# Patient Record
Sex: Female | Born: 1965 | Race: White | Hispanic: No | Marital: Single | Smoking: Former smoker
Health system: Southern US, Community
[De-identification: ages and names within clinical notes are randomized; demographics above are authoritative.]

## PROBLEM LIST (undated history)

## (undated) DIAGNOSIS — I10 Essential (primary) hypertension: Secondary | ICD-10-CM

## (undated) DIAGNOSIS — K5792 Diverticulitis of intestine, part unspecified, without perforation or abscess without bleeding: Secondary | ICD-10-CM

## (undated) DIAGNOSIS — F32A Depression, unspecified: Secondary | ICD-10-CM

## (undated) DIAGNOSIS — F329 Major depressive disorder, single episode, unspecified: Secondary | ICD-10-CM

## (undated) DIAGNOSIS — F419 Anxiety disorder, unspecified: Secondary | ICD-10-CM

## (undated) DIAGNOSIS — R079 Chest pain, unspecified: Secondary | ICD-10-CM

## (undated) HISTORY — DX: Major depressive disorder, single episode, unspecified: F32.9

## (undated) HISTORY — DX: Essential (primary) hypertension: I10

## (undated) HISTORY — DX: Depression, unspecified: F32.A

## (undated) HISTORY — DX: Chest pain, unspecified: R07.9

## (undated) HISTORY — PX: COLON SURGERY: SHX602

## (undated) HISTORY — DX: Anxiety disorder, unspecified: F41.9

---

## 1991-02-08 HISTORY — PX: CERVICAL CONIZATION W/BX: SHX1330

## 2014-02-20 ENCOUNTER — Ambulatory Visit: Payer: Self-pay | Admitting: Gastroenterology

## 2014-05-29 ENCOUNTER — Encounter: Payer: Self-pay | Admitting: Family Medicine

## 2014-05-29 DIAGNOSIS — I10 Essential (primary) hypertension: Secondary | ICD-10-CM | POA: Insufficient documentation

## 2014-05-29 DIAGNOSIS — Z8541 Personal history of malignant neoplasm of cervix uteri: Secondary | ICD-10-CM | POA: Insufficient documentation

## 2014-05-29 DIAGNOSIS — K219 Gastro-esophageal reflux disease without esophagitis: Secondary | ICD-10-CM | POA: Insufficient documentation

## 2014-05-29 DIAGNOSIS — F419 Anxiety disorder, unspecified: Secondary | ICD-10-CM | POA: Insufficient documentation

## 2014-05-29 DIAGNOSIS — F324 Major depressive disorder, single episode, in partial remission: Secondary | ICD-10-CM | POA: Insufficient documentation

## 2014-08-13 ENCOUNTER — Other Ambulatory Visit: Payer: Self-pay

## 2014-08-13 DIAGNOSIS — F411 Generalized anxiety disorder: Secondary | ICD-10-CM

## 2014-08-13 MED ORDER — LORAZEPAM 0.5 MG PO TABS
0.5000 mg | ORAL_TABLET | Freq: Three times a day (TID) | ORAL | Status: DC | PRN
Start: 2014-08-13 — End: 2015-06-08

## 2014-08-13 NOTE — Telephone Encounter (Signed)
Pharmacy faxed over Rx request. Thanks.

## 2014-09-01 ENCOUNTER — Other Ambulatory Visit: Payer: Self-pay

## 2014-09-03 ENCOUNTER — Other Ambulatory Visit: Payer: Self-pay

## 2014-09-03 DIAGNOSIS — K219 Gastro-esophageal reflux disease without esophagitis: Secondary | ICD-10-CM

## 2014-09-03 MED ORDER — PANTOPRAZOLE SODIUM 40 MG PO TBEC: 40.0000 mg | DELAYED_RELEASE_TABLET | Freq: Every day | ORAL | Status: DC

## 2014-09-03 NOTE — Telephone Encounter (Signed)
error 

## 2014-09-08 ENCOUNTER — Telehealth: Payer: Self-pay

## 2014-09-09 NOTE — Telephone Encounter (Signed)
I show pt taking lisinopril/HCTZ not quinapril.

## 2014-09-10 ENCOUNTER — Other Ambulatory Visit: Payer: Self-pay

## 2014-09-11 ENCOUNTER — Other Ambulatory Visit: Payer: Self-pay

## 2014-09-22 ENCOUNTER — Other Ambulatory Visit: Payer: Self-pay | Admitting: Family Medicine

## 2015-05-03 ENCOUNTER — Other Ambulatory Visit: Payer: Self-pay | Admitting: Gastroenterology

## 2015-05-06 ENCOUNTER — Other Ambulatory Visit: Payer: Self-pay

## 2015-05-06 DIAGNOSIS — K2100 Gastro-esophageal reflux disease with esophagitis, without bleeding: Secondary | ICD-10-CM

## 2015-05-06 MED ORDER — PANTOPRAZOLE SODIUM 40 MG PO TBEC: 40.0000 mg | DELAYED_RELEASE_TABLET | Freq: Every day | ORAL | Status: DC

## 2015-06-08 ENCOUNTER — Encounter: Payer: Self-pay | Admitting: Family Medicine

## 2015-06-08 ENCOUNTER — Ambulatory Visit (INDEPENDENT_AMBULATORY_CARE_PROVIDER_SITE_OTHER): Payer: BLUE CROSS/BLUE SHIELD | Admitting: Family Medicine

## 2015-06-08 VITALS — BP 122/86 | HR 72 | Temp 99.8°F | Resp 16 | Ht 64.5 in | Wt 156.0 lb

## 2015-06-08 DIAGNOSIS — Z23 Encounter for immunization: Secondary | ICD-10-CM | POA: Diagnosis not present

## 2015-06-08 DIAGNOSIS — H73012 Bullous myringitis, left ear: Secondary | ICD-10-CM

## 2015-06-08 DIAGNOSIS — J0100 Acute maxillary sinusitis, unspecified: Secondary | ICD-10-CM | POA: Diagnosis not present

## 2015-06-08 DIAGNOSIS — Z720 Tobacco use: Secondary | ICD-10-CM | POA: Diagnosis not present

## 2015-06-08 DIAGNOSIS — E663 Overweight: Secondary | ICD-10-CM

## 2015-06-08 DIAGNOSIS — E785 Hyperlipidemia, unspecified: Secondary | ICD-10-CM | POA: Diagnosis not present

## 2015-06-08 DIAGNOSIS — E559 Vitamin D deficiency, unspecified: Secondary | ICD-10-CM

## 2015-06-08 DIAGNOSIS — K222 Esophageal obstruction: Secondary | ICD-10-CM

## 2015-06-08 DIAGNOSIS — I10 Essential (primary) hypertension: Secondary | ICD-10-CM

## 2015-06-08 DIAGNOSIS — F32A Depression, unspecified: Secondary | ICD-10-CM

## 2015-06-08 DIAGNOSIS — F329 Major depressive disorder, single episode, unspecified: Secondary | ICD-10-CM

## 2015-06-08 DIAGNOSIS — F17200 Nicotine dependence, unspecified, uncomplicated: Secondary | ICD-10-CM

## 2015-06-08 MED ORDER — AZITHROMYCIN 250 MG PO TABS
ORAL_TABLET | ORAL | Status: DC
Start: 2015-06-08 — End: 2015-10-23

## 2015-06-08 MED ORDER — PROMETHAZINE-CODEINE 6.25-10 MG/5ML PO SYRP: 5.0000 mL | ORAL_SOLUTION | Freq: Four times a day (QID) | ORAL | Status: DC | PRN

## 2015-06-09 DIAGNOSIS — E782 Mixed hyperlipidemia: Secondary | ICD-10-CM | POA: Insufficient documentation

## 2015-06-09 DIAGNOSIS — E559 Vitamin D deficiency, unspecified: Secondary | ICD-10-CM | POA: Insufficient documentation

## 2015-06-09 LAB — CBC
Hematocrit: 44.7 % (ref 34.0–46.6)
Hemoglobin: 15 g/dL (ref 11.1–15.9)
MCH: 32.7 pg (ref 26.6–33.0)
MCHC: 33.6 g/dL (ref 31.5–35.7)
MCV: 97 fL (ref 79–97)
Platelets: 395 10*3/uL — ABNORMAL HIGH (ref 150–379)
RBC: 4.59 x10E6/uL (ref 3.77–5.28)
RDW: 13.2 % (ref 12.3–15.4)
WBC: 9.1 10*3/uL (ref 3.4–10.8)

## 2015-06-09 LAB — LIPID PANEL
Chol/HDL Ratio: 6.5 ratio units — ABNORMAL HIGH (ref 0.0–4.4)
Cholesterol, Total: 221 mg/dL — ABNORMAL HIGH (ref 100–199)
HDL: 34 mg/dL — ABNORMAL LOW (ref >39)
LDL Calculated: 141 mg/dL — ABNORMAL HIGH (ref 0–99)
Triglycerides: 228 mg/dL — ABNORMAL HIGH (ref 0–149)
VLDL Cholesterol Cal: 46 mg/dL — ABNORMAL HIGH (ref 5–40)

## 2015-06-09 LAB — COMPREHENSIVE METABOLIC PANEL
ALT: 33 IU/L — ABNORMAL HIGH (ref 0–32)
AST: 17 IU/L (ref 0–40)
Albumin/Globulin Ratio: 1.8 (ref 1.2–2.2)
Albumin: 4.6 g/dL (ref 3.5–5.5)
Alkaline Phosphatase: 72 IU/L (ref 39–117)
BUN/Creatinine Ratio: 16 (ref 9–23)
BUN: 11 mg/dL (ref 6–24)
Bilirubin Total: 0.3 mg/dL (ref 0.0–1.2)
CO2: 23 mmol/L (ref 18–29)
Calcium: 10.1 mg/dL (ref 8.7–10.2)
Chloride: 99 mmol/L (ref 96–106)
Creatinine, Ser: 0.67 mg/dL (ref 0.57–1.00)
GFR calc Af Amer: 118 mL/min/{1.73_m2} (ref >59)
GFR calc non Af Amer: 102 mL/min/{1.73_m2} (ref >59)
Globulin, Total: 2.6 g/dL (ref 1.5–4.5)
Glucose: 102 mg/dL — ABNORMAL HIGH (ref 65–99)
Potassium: 4.5 mmol/L (ref 3.5–5.2)
Sodium: 140 mmol/L (ref 134–144)
Total Protein: 7.2 g/dL (ref 6.0–8.5)

## 2015-06-09 LAB — TSH: TSH: 1.39 u[IU]/mL (ref 0.450–4.500)

## 2015-06-09 LAB — HEPATITIS C ANTIBODY: Hep C Virus Ab: <0.1 s/co ratio (ref 0.0–0.9)

## 2015-06-09 LAB — VITAMIN D 25 HYDROXY (VIT D DEFICIENCY, FRACTURES): Vit D, 25-Hydroxy: 21.4 ng/mL — ABNORMAL LOW (ref 30.0–100.0)

## 2015-06-09 MED ORDER — PRAVASTATIN SODIUM 40 MG PO TABS: 40.0000 mg | ORAL_TABLET | Freq: Every day | ORAL | Status: DC

## 2015-06-09 MED ORDER — VITAMIN D 50 MCG (2000 UT) PO CAPS: 1.0000 | ORAL_CAPSULE | Freq: Every day | ORAL | Status: DC

## 2015-06-09 NOTE — Addendum Note (Signed)
Addended by: Adline Potter on: 06/09/2015 02:19 PM   Modules accepted: Orders

## 2015-06-09 NOTE — Progress Notes (Addendum)
Date:  06/08/2015   Name:  Haley Mejia   DOB:  03-29-1965   MRN:  [id number]  PCP:  No primary care provider on file.    Chief Complaint: Cough   History of Present Illness:  This is a 50 y.o. female seen for first f/u in over a year. C/o 1 week hx sinud congestion, sore throat, rhinorrhea, NP cough, LG fever keeping up at night, sxs not improving, Nyquil not helping. Hx HTN on Prinzide, depression well controlled on Zoloft, no longer taking Ativan. On chronic Protonix for esophageal stricture on EGD 02/2014, reflux sxs recur if misses dose. Weight stable, no regular exercise.  Review of Systems:  Review of Systems  HENT: Negative for trouble swallowing.   Respiratory: Negative for shortness of breath.   Cardiovascular: Negative for chest pain and leg swelling.  Endocrine: Negative for polyuria.  Genitourinary: Negative for difficulty urinating.  Neurological: Negative for syncope and light-headedness.    Patient Active Problem List   Diagnosis Date Noted  . Esophageal stricture 06/08/2015  . Overweight (BMI 25.0-29.9) 06/08/2015  . Anxiety disorder 05/29/2014  . History of cervical cancer 05/29/2014  . Acid reflux 05/29/2014  . Depression 05/29/2014  . Hypertension 05/29/2014    Prior to Admission medications   Medication Sig Start Date End Date Taking? Authorizing Provider  lisinopril-hydrochlorothiazide (PRINZIDE,ZESTORETIC) 10-12.5 MG per tablet Take 1 tablet by mouth daily. 05/15/14  Yes Historical Provider, MD  pantoprazole (PROTONIX) 40 MG tablet Take 1 tablet (40 mg total) by mouth daily. 05/06/15  Yes Lucilla Lame, MD  Pseudoeph-Doxylamine-DM-APAP (NYQUIL PO) Take by mouth.   Yes Historical Provider, MD  sertraline (ZOLOFT) 100 MG tablet Take 1 tablet by mouth daily. 05/15/14  Yes Historical Provider, MD  azithromycin (ZITHROMAX) 250 MG tablet Take two tablets today then one tablet daily for four days. 06/08/15   Adline Potter, MD  promethazine-codeine (PHENERGAN WITH CODEINE)  6.25-10 MG/5ML syrup Take 5 mLs by mouth every 6 (six) hours as needed for cough. 06/08/15   Adline Potter, MD    No Known Allergies  Past Surgical History  Procedure Laterality Date  . Cervix surgery      Social History  Substance Use Topics  . Smoking status: Current Every Day Smoker -- 0.25 packs/day    Types: Cigarettes  . Smokeless tobacco: None  . Alcohol Use: 1.2 oz/week    2 Glasses of wine per week    Family History  Problem Relation Age of Onset  . Alzheimer's disease Mother     Medication list has been reviewed and updated.  Physical Examination: BP 122/86 mmHg  Pulse 72  Temp(Src) 99.8 F (37.7 C) (Oral)  Resp 16  Ht 5' 4.5" (1.638 m)  Wt 156 lb (70.761 kg)  BMI 26.37 kg/m2  SpO2 98%  Physical Exam  Constitutional: She appears well-developed and well-nourished.  HENT:  Right Ear: External ear normal.  Left Ear: External ear normal.  Mouth/Throat: Oropharynx is clear and moist.  TM's clear, mild B max sinus tenderness  Cardiovascular: Normal rate, regular rhythm and normal heart sounds.   Pulmonary/Chest: Effort normal and breath sounds normal.  Musculoskeletal: She exhibits no edema.  Neurological: She is alert.  Skin: Skin is warm and dry.  Psychiatric: She has a normal mood and affect. Her behavior is normal.  Nursing note and vitals reviewed.   Assessment and Plan:  1. Acute maxillary sinusitis, recurrence not specified Zpak as directed, Phenergan with codeine prn cough, pt requested steroids but no  indication for use  2. Bullous myringitis, left Zpak should cover  3. Essential hypertension Well controlled on Prinzide - Comprehensive Metabolic Panel (CMET) - CBC - Lipid Profile  4. Esophageal stricture On chronic PPI per GI  5. Depression Well controlled on Zoloft  6. Overweight (BMI 25.0-29.9) Exercise/weight loss discussed - TSH - Vitamin D (25 hydroxy) - Hepatitis C Antibody  7. Need for tetanus booster - Tdap vaccine  greater than or equal to 7yo IM  8. Smoker Advised cessation  Return in about 6 months (around 12/09/2015).  Satira Anis. [facility]  06/09/2015

## 2015-07-08 ENCOUNTER — Other Ambulatory Visit: Payer: Self-pay | Admitting: Family Medicine

## 2015-07-08 MED ORDER — SERTRALINE HCL 100 MG PO TABS: 100.0000 mg | ORAL_TABLET | Freq: Every day | ORAL | Status: DC

## 2015-08-11 ENCOUNTER — Other Ambulatory Visit: Payer: Self-pay | Admitting: Family Medicine

## 2015-08-12 ENCOUNTER — Other Ambulatory Visit: Payer: Self-pay

## 2015-09-08 ENCOUNTER — Other Ambulatory Visit: Payer: Self-pay

## 2015-09-08 DIAGNOSIS — K2100 Gastro-esophageal reflux disease with esophagitis, without bleeding: Secondary | ICD-10-CM

## 2015-09-08 MED ORDER — PANTOPRAZOLE SODIUM 40 MG PO TBEC: 40.0000 mg | DELAYED_RELEASE_TABLET | Freq: Every day | ORAL | 0 refills | Status: DC

## 2015-10-02 ENCOUNTER — Telehealth: Payer: Self-pay

## 2015-10-05 ENCOUNTER — Other Ambulatory Visit: Payer: Self-pay | Admitting: Internal Medicine

## 2015-10-05 MED ORDER — PRAVASTATIN SODIUM 40 MG PO TABS: 40.0000 mg | ORAL_TABLET | Freq: Every day | ORAL | 0 refills | Status: DC

## 2015-10-07 NOTE — Telephone Encounter (Signed)
Spoke tp pt

## 2015-10-23 ENCOUNTER — Ambulatory Visit (INDEPENDENT_AMBULATORY_CARE_PROVIDER_SITE_OTHER): Payer: BLUE CROSS/BLUE SHIELD | Admitting: Internal Medicine

## 2015-10-23 ENCOUNTER — Encounter: Payer: Self-pay | Admitting: Internal Medicine

## 2015-10-23 ENCOUNTER — Ambulatory Visit
Admission: RE | Admit: 2015-10-23 | Discharge: 2015-10-23 | Disposition: A | Discharge status: Home / Self Care | Payer: BLUE CROSS/BLUE SHIELD | Source: Ambulatory Visit | Attending: Internal Medicine | Admitting: Internal Medicine

## 2015-10-23 VITALS — BP 142/88 | HR 100 | Resp 16 | Ht 64.5 in | Wt 157.0 lb

## 2015-10-23 DIAGNOSIS — J42 Unspecified chronic bronchitis: Secondary | ICD-10-CM | POA: Insufficient documentation

## 2015-10-23 DIAGNOSIS — R519 Headache, unspecified: Secondary | ICD-10-CM | POA: Insufficient documentation

## 2015-10-23 DIAGNOSIS — R51 Headache: Secondary | ICD-10-CM | POA: Diagnosis not present

## 2015-10-23 DIAGNOSIS — K2970 Gastritis, unspecified, without bleeding: Secondary | ICD-10-CM | POA: Insufficient documentation

## 2015-10-23 DIAGNOSIS — K591 Functional diarrhea: Secondary | ICD-10-CM

## 2015-10-23 DIAGNOSIS — F17200 Nicotine dependence, unspecified, uncomplicated: Secondary | ICD-10-CM | POA: Insufficient documentation

## 2015-10-23 DIAGNOSIS — I10 Essential (primary) hypertension: Secondary | ICD-10-CM

## 2015-10-23 DIAGNOSIS — R079 Chest pain, unspecified: Secondary | ICD-10-CM

## 2015-10-23 DIAGNOSIS — D126 Benign neoplasm of colon, unspecified: Secondary | ICD-10-CM | POA: Insufficient documentation

## 2015-10-23 DIAGNOSIS — F411 Generalized anxiety disorder: Secondary | ICD-10-CM | POA: Diagnosis not present

## 2015-10-23 DIAGNOSIS — G4700 Insomnia, unspecified: Secondary | ICD-10-CM

## 2015-10-23 DIAGNOSIS — Z720 Tobacco use: Secondary | ICD-10-CM | POA: Diagnosis not present

## 2015-10-23 DIAGNOSIS — F17210 Nicotine dependence, cigarettes, uncomplicated: Secondary | ICD-10-CM | POA: Insufficient documentation

## 2015-10-23 DIAGNOSIS — E785 Hyperlipidemia, unspecified: Secondary | ICD-10-CM | POA: Diagnosis not present

## 2015-10-23 HISTORY — DX: Chest pain, unspecified: R07.9

## 2015-10-23 MED ORDER — TRAZODONE HCL 50 MG PO TABS: 50.0000 mg | ORAL_TABLET | Freq: Every evening | ORAL | 3 refills | Status: DC | PRN

## 2015-10-23 NOTE — Progress Notes (Signed)
Date:  10/23/2015   Name:  Haley Mejia   DOB:  06/14/1965   MRN:  [id number]   Chief Complaint: Hypertension; Headache; Chest Pain (feels pressure in chest ); and Diarrhea (months of going to bathroom after a meal and having loose stools. ) Hypertension  This is a chronic problem. The current episode started more than 1 year ago. The problem is unchanged. The problem is controlled. Associated symptoms include chest pain and headaches. Pertinent negatives include no palpitations or shortness of breath.  Hyperlipidemia  This is a chronic problem. Associated symptoms include chest pain. Pertinent negatives include no shortness of breath. Current antihyperlipidemic treatment includes statins (started last visit).  Diarrhea   This is a recurrent problem. The problem occurs 2 to 4 times per day. The problem has been unchanged. The patient states that diarrhea does not awaken her from sleep. Associated symptoms include abdominal pain (cramping with urgency) and headaches. Pertinent negatives include no arthralgias, bloating, chills, coughing, fever or vomiting. Exacerbated by: eating. She has tried nothing for the symptoms. increase in zoloft dose one year ago  Chest Pain   This is a new problem. The current episode started in the past 7 days. The problem has been unchanged. The pain is present in the substernal region. The quality of the pain is described as pressure. The pain radiates to the left shoulder and right shoulder. Associated symptoms include abdominal pain (cramping with urgency), headaches and nausea. Pertinent negatives include no back pain, cough, dizziness, exertional chest pressure, fever, numbness, palpitations, shortness of breath, vomiting or weakness. The pain is aggravated by nothing. She has tried nothing for the symptoms. Risk factors include smoking/tobacco exposure.  Her past medical history is significant for hyperlipidemia and hypertension.  Pertinent negatives for past  medical history include no seizures. Past medical history comments: increase in zoloft dose one year ago  Her family medical history is significant for heart disease.   Headache  - started a few days ago.  Across the temples and responds to advil.  She has hx of migraines but this is not as severe.  No migraine HA in years.   Review of Systems  Constitutional: Negative for chills, fatigue, fever and unexpected weight change.  HENT: Negative for ear pain, tinnitus, trouble swallowing and voice change.   Eyes: Negative for visual disturbance.  Respiratory: Negative for cough and shortness of breath.   Cardiovascular: Positive for chest pain. Negative for palpitations.  Gastrointestinal: Positive for abdominal pain (cramping with urgency), diarrhea and nausea. Negative for bloating and vomiting.  Genitourinary: Negative for dysuria.  Musculoskeletal: Negative for arthralgias and back pain.  Skin: Positive for rash.  Neurological: Positive for headaches. Negative for dizziness, tremors, seizures, syncope, weakness and numbness.  Hematological: Negative for adenopathy.  Psychiatric/Behavioral: Positive for sleep disturbance. Negative for confusion and hallucinations. The patient is nervous/anxious.     Patient Active Problem List   Diagnosis Date Noted  . Adenomatous colon polyp 10/23/2015  . Tobacco use disorder 10/23/2015  . Vitamin D deficiency 06/09/2015  . Hyperlipidemia 06/09/2015  . Esophageal stricture 06/08/2015  . Overweight (BMI 25.0-29.9) 06/08/2015  . Anxiety disorder 05/29/2014  . History of cervical cancer 05/29/2014  . Acid reflux 05/29/2014  . Depression 05/29/2014  . Hypertension 05/29/2014    Prior to Admission medications   Medication Sig Start Date End Date Taking? Authorizing Provider  Cholecalciferol (VITAMIN D) 2000 units CAPS Take 1 capsule (2,000 Units total) by mouth daily. 06/09/15  Yes Gwyndolyn Saxon  Plonk, MD  lisinopril-hydrochlorothiazide (PRINZIDE,ZESTORETIC)  10-12.5 MG tablet TAKE 1 TABLET BY MOUTH EVERY DAY 08/11/15  Yes Glean Hess, MD  pantoprazole (PROTONIX) 40 MG tablet Take 1 tablet (40 mg total) by mouth daily. 09/08/15  Yes Glean Hess, MD  pravastatin (PRAVACHOL) 40 MG tablet Take 1 tablet (40 mg total) by mouth daily. 10/05/15  Yes Adline Potter, MD  sertraline (ZOLOFT) 100 MG tablet Take 1 tablet (100 mg total) by mouth daily. 07/08/15  Yes Adline Potter, MD    No Known Allergies  Past Surgical History:  Procedure Laterality Date  . CERVIX SURGERY      Social History  Substance Use Topics  . Smoking status: Current Every Day Smoker    Packs/day: 0.25    Types: Cigarettes  . Smokeless tobacco: Never Used  . Alcohol use 1.2 oz/week    2 Glasses of wine per week     Medication list has been reviewed and updated.   Physical Exam  Constitutional: She is oriented to person, place, and time. She appears well-developed and well-nourished. No distress.  HENT:  Head: Normocephalic and atraumatic.  Eyes: Pupils are equal, round, and reactive to light.  Neck: Normal range of motion. Neck supple. Carotid bruit is not present. No thyromegaly present.  Cardiovascular: Normal rate, regular rhythm, S1 normal and normal heart sounds.   Pulmonary/Chest: Effort normal and breath sounds normal. No respiratory distress. She has no wheezes. She exhibits no mass and no tenderness.  Musculoskeletal: Normal range of motion.  Neurological: She is alert and oriented to person, place, and time. She has normal reflexes.  Skin: Skin is warm and dry. No rash noted.  Psychiatric: She has a normal mood and affect. Her speech is normal and behavior is normal. Thought content normal.  Nursing note and vitals reviewed.   BP (!) 138/98 (BP Location: Right Arm, Patient Position: Sitting, Cuff Size: Normal)   Pulse 100   Resp 16   Ht 5' 4.5" (1.638 m)   Wt 157 lb (71.2 kg)   SpO2 96%   BMI 26.53 kg/m   Assessment and Plan: 1. Chest pain at  rest Aspirin 81 mg daily Cardiology referral - DG Chest 2 View; Future - EKG 12-Lead  2. Gastritis determined by endoscopy Continue PPI  3. Smoker Urged to work on quitting - Nurse to provide smoking / tobacco cessation education  4. Insomnia - traZODone (DESYREL) 50 MG tablet; Take 1 tablet (50 mg total) by mouth at bedtime as needed for sleep.  Dispense: 30 tablet; Refill: 3  5. Essential hypertension Fair control - CBC with Differential/Platelet  6. Generalized anxiety disorder Continue zoloft - rec 50 mg daily for 2 weeks to see if diarrhea improved  7. Hyperlipidemia On statin therapy now - Comprehensive metabolic panel - Lipid panel  8. Functional diarrhea Trial of lower dose zoloft  9. Headache, unspecified headache type Continue tylenol or advil as needed   Halina Maidens, MD [facility]  10/23/2015

## 2015-10-23 NOTE — Patient Instructions (Signed)
Begin Aspirin 81mg daily

## 2015-10-24 LAB — CBC WITH DIFFERENTIAL/PLATELET
Basophils Absolute: 0 10*3/uL (ref 0.0–0.2)
Basos: 0 %
EOS (ABSOLUTE): 0.1 10*3/uL (ref 0.0–0.4)
Eos: 1 %
Hematocrit: 44.6 % (ref 34.0–46.6)
Hemoglobin: 15.3 g/dL (ref 11.1–15.9)
Immature Grans (Abs): 0 10*3/uL (ref 0.0–0.1)
Immature Granulocytes: 0 %
Lymphocytes Absolute: 2 10*3/uL (ref 0.7–3.1)
Lymphs: 22 %
MCH: 34.4 pg — ABNORMAL HIGH (ref 26.6–33.0)
MCHC: 34.3 g/dL (ref 31.5–35.7)
MCV: 100 fL — ABNORMAL HIGH (ref 79–97)
Monocytes Absolute: 0.7 10*3/uL (ref 0.1–0.9)
Monocytes: 8 %
Neutrophils Absolute: 6.3 10*3/uL (ref 1.4–7.0)
Neutrophils: 69 %
Platelets: 379 10*3/uL (ref 150–379)
RBC: 4.45 x10E6/uL (ref 3.77–5.28)
RDW: 13.3 % (ref 12.3–15.4)
WBC: 9.1 10*3/uL (ref 3.4–10.8)

## 2015-10-24 LAB — COMPREHENSIVE METABOLIC PANEL
ALT: 46 IU/L — ABNORMAL HIGH (ref 0–32)
AST: 32 IU/L (ref 0–40)
Albumin/Globulin Ratio: 1.8 (ref 1.2–2.2)
Albumin: 4.9 g/dL (ref 3.5–5.5)
Alkaline Phosphatase: 67 IU/L (ref 39–117)
BUN/Creatinine Ratio: 20 (ref 9–23)
BUN: 15 mg/dL (ref 6–24)
Bilirubin Total: 0.5 mg/dL (ref 0.0–1.2)
CO2: 22 mmol/L (ref 18–29)
Calcium: 10.3 mg/dL — ABNORMAL HIGH (ref 8.7–10.2)
Chloride: 97 mmol/L (ref 96–106)
Creatinine, Ser: 0.76 mg/dL (ref 0.57–1.00)
GFR calc Af Amer: 104 mL/min/{1.73_m2} (ref >59)
GFR calc non Af Amer: 90 mL/min/{1.73_m2} (ref >59)
Globulin, Total: 2.7 g/dL (ref 1.5–4.5)
Glucose: 92 mg/dL (ref 65–99)
Potassium: 4.5 mmol/L (ref 3.5–5.2)
Sodium: 140 mmol/L (ref 134–144)
Total Protein: 7.6 g/dL (ref 6.0–8.5)

## 2015-10-24 LAB — LIPID PANEL
Chol/HDL Ratio: 3.6 ratio units (ref 0.0–4.4)
Cholesterol, Total: 171 mg/dL (ref 100–199)
HDL: 48 mg/dL (ref >39)
LDL Calculated: 92 mg/dL (ref 0–99)
Triglycerides: 155 mg/dL — ABNORMAL HIGH (ref 0–149)
VLDL Cholesterol Cal: 31 mg/dL (ref 5–40)

## 2015-12-11 ENCOUNTER — Ambulatory Visit: Payer: BLUE CROSS/BLUE SHIELD | Admitting: Family Medicine

## 2015-12-30 ENCOUNTER — Encounter: Payer: Self-pay | Admitting: Internal Medicine

## 2015-12-30 ENCOUNTER — Other Ambulatory Visit: Payer: Self-pay

## 2015-12-30 ENCOUNTER — Ambulatory Visit (INDEPENDENT_AMBULATORY_CARE_PROVIDER_SITE_OTHER): Payer: BLUE CROSS/BLUE SHIELD | Admitting: Internal Medicine

## 2015-12-30 VITALS — BP 132/83 | HR 93 | Temp 98.3°F | Resp 16 | Ht 64.5 in | Wt 156.0 lb

## 2015-12-30 DIAGNOSIS — J069 Acute upper respiratory infection, unspecified: Secondary | ICD-10-CM

## 2015-12-30 DIAGNOSIS — B9789 Other viral agents as the cause of diseases classified elsewhere: Secondary | ICD-10-CM | POA: Diagnosis not present

## 2015-12-30 MED ORDER — GUAIFENESIN-CODEINE 100-10 MG/5ML PO SYRP: 5.0000 mL | ORAL_SOLUTION | Freq: Three times a day (TID) | ORAL | 0 refills | Status: DC | PRN

## 2015-12-30 MED ORDER — AMOXICILLIN-POT CLAVULANATE 875-125 MG PO TABS: 1.0000 | ORAL_TABLET | Freq: Two times a day (BID) | ORAL | 0 refills | Status: DC

## 2015-12-30 NOTE — Progress Notes (Signed)
Date:  12/30/2015   Name:  Haley Mejia   DOB:  1965/11/16   MRN:  [id number]   Chief Complaint: Cough (Sinus congestion and chest congestion. Post Nasal Drip, headache and denies fever x 3 days. )  Cough  This is a new problem. The current episode started in the past 7 days. The problem has been unchanged. The problem occurs hourly. The cough is non-productive. Associated symptoms include postnasal drip and a sore throat. Pertinent negatives include no chest pain, chills, ear pain, fever, headaches, nasal congestion, shortness of breath or wheezing. The symptoms are aggravated by lying down. Risk factors for lung disease include smoking/tobacco exposure. She has tried OTC cough suppressant for the symptoms. Her past medical history is significant for bronchitis.     Review of Systems  Constitutional: Negative for chills and fever.  HENT: Positive for postnasal drip and sore throat. Negative for congestion, ear discharge, ear pain, mouth sores and sinus pain.   Eyes: Negative for visual disturbance.  Respiratory: Positive for cough. Negative for chest tightness, shortness of breath and wheezing.   Cardiovascular: Negative for chest pain and palpitations.  Neurological: Negative for dizziness and headaches.    Patient Active Problem List   Diagnosis Date Noted  . Adenomatous colon polyp 10/23/2015  . Tobacco use disorder 10/23/2015  . Gastritis determined by endoscopy 10/23/2015  . Cephalalgia 10/23/2015  . Functional diarrhea 10/23/2015  . Insomnia 10/23/2015  . Chest pain with high risk for cardiac etiology 10/23/2015  . Vitamin D deficiency 06/09/2015  . Hyperlipidemia 06/09/2015  . Esophageal stricture 06/08/2015  . Overweight (BMI 25.0-29.9) 06/08/2015  . Anxiety disorder 05/29/2014  . History of cervical cancer 05/29/2014  . Depression 05/29/2014  . Hypertension 05/29/2014    Prior to Admission medications   Medication Sig Start Date End Date Taking? Authorizing  Provider  Cholecalciferol (VITAMIN D-1000 MAX ST) 1000 units tablet Take by mouth.   Yes Historical Provider, MD  lisinopril-hydrochlorothiazide (PRINZIDE,ZESTORETIC) 10-12.5 MG tablet Take by mouth.   Yes Historical Provider, MD  pantoprazole (PROTONIX) 40 MG tablet Take by mouth.   Yes Historical Provider, MD  pravastatin (PRAVACHOL) 40 MG tablet Take by mouth.   Yes Historical Provider, MD  Pseudoeph-Doxylamine-DM-APAP (NYQUIL PO) Take by mouth.   Yes Historical Provider, MD  Pseudoephedrine-APAP-DM (DAYQUIL PO) Take by mouth.   Yes Historical Provider, MD  sertraline (ZOLOFT) 100 MG tablet Take by mouth.   Yes Historical Provider, MD  traZODone (DESYREL) 50 MG tablet Take 1 tablet (50 mg total) by mouth at bedtime as needed for sleep. 10/23/15  Yes Glean Hess, MD    No Known Allergies  Past Surgical History:  Procedure Laterality Date  . CERVIX SURGERY      Social History  Substance Use Topics  . Smoking status: Current Every Day Smoker    Packs/day: 0.25    Types: Cigarettes  . Smokeless tobacco: Never Used  . Alcohol use 1.2 oz/week    2 Glasses of wine per week     Medication list has been reviewed and updated.   Physical Exam  Constitutional: She appears well-developed and well-nourished.  HENT:  Right Ear: Ear canal normal. Tympanic membrane is perforated. Tympanic membrane is not erythematous and not retracted.  Left Ear: Ear canal normal. Tympanic membrane is perforated. Tympanic membrane is not erythematous and not retracted.  Nose: Right sinus exhibits no maxillary sinus tenderness and no frontal sinus tenderness. Left sinus exhibits no maxillary sinus tenderness and no  frontal sinus tenderness.  Mouth/Throat: No posterior oropharyngeal edema or posterior oropharyngeal erythema.  Eyes: Pupils are equal, round, and reactive to light.  Neck: Normal range of motion. Neck supple.  Cardiovascular: Normal rate, regular rhythm and normal heart sounds.     Pulmonary/Chest: Effort normal and breath sounds normal. No respiratory distress. She has no decreased breath sounds. She has no wheezes. She has no rhonchi.  Lymphadenopathy:    She has no cervical adenopathy.    BP 132/83   Pulse 93   Temp 98.3 F (36.8 C)   Resp 16   Ht 5' 4.5" (1.638 m)   Wt 156 lb (70.8 kg)   SpO2 98%   BMI 26.36 kg/m   Assessment and Plan: 1. Viral URI Use codeine syrup at night for sleep Can begin Augmentin only if sx progress to sputum production, fever, sinus discharge, etc Flonase NS - guaiFENesin-codeine (ROBITUSSIN AC) 100-10 MG/5ML syrup; Take 5 mLs by mouth 3 (three) times daily as needed for cough.  Dispense: 150 mL; Refill: 0 - amoxicillin-clavulanate (AUGMENTIN) 875-125 MG tablet; Take 1 tablet by mouth 2 (two) times daily.  Dispense: 20 tablet; Refill: 0   Halina Maidens, MD [facility]  12/30/2015

## 2016-01-05 ENCOUNTER — Other Ambulatory Visit: Payer: Self-pay | Admitting: Internal Medicine

## 2016-01-05 DIAGNOSIS — J069 Acute upper respiratory infection, unspecified: Secondary | ICD-10-CM

## 2016-01-06 ENCOUNTER — Telehealth: Payer: Self-pay

## 2016-01-06 NOTE — Telephone Encounter (Signed)
She was given a 10 day supply if taken three times a day.  She should not be out of it yet.  I can only assume that she took more than prescribed, therefore I denied a refill.

## 2016-01-06 NOTE — Telephone Encounter (Signed)
Denied refill on cough Rx and wants to know why. Was seen last week for cough and is not better.

## 2016-01-06 NOTE — Telephone Encounter (Signed)
Mailbox full

## 2016-01-11 ENCOUNTER — Other Ambulatory Visit: Payer: Self-pay | Admitting: Internal Medicine

## 2016-01-30 ENCOUNTER — Other Ambulatory Visit: Payer: Self-pay | Admitting: Internal Medicine

## 2016-01-30 DIAGNOSIS — K2100 Gastro-esophageal reflux disease with esophagitis, without bleeding: Secondary | ICD-10-CM

## 2016-02-23 ENCOUNTER — Other Ambulatory Visit: Payer: Self-pay | Admitting: Internal Medicine

## 2016-03-21 ENCOUNTER — Other Ambulatory Visit: Payer: Self-pay | Admitting: Internal Medicine

## 2016-03-21 DIAGNOSIS — F5101 Primary insomnia: Secondary | ICD-10-CM

## 2016-03-21 MED ORDER — TRAZODONE HCL 50 MG PO TABS: 50.0000 mg | ORAL_TABLET | Freq: Every evening | ORAL | 1 refills | Status: DC | PRN

## 2016-04-25 ENCOUNTER — Encounter: Payer: BLUE CROSS/BLUE SHIELD | Admitting: Internal Medicine

## 2016-04-30 ENCOUNTER — Other Ambulatory Visit: Payer: Self-pay | Admitting: Internal Medicine

## 2016-04-30 DIAGNOSIS — K2100 Gastro-esophageal reflux disease with esophagitis, without bleeding: Secondary | ICD-10-CM

## 2016-06-28 DIAGNOSIS — F329 Major depressive disorder, single episode, unspecified: Secondary | ICD-10-CM | POA: Diagnosis not present

## 2016-06-28 DIAGNOSIS — F419 Anxiety disorder, unspecified: Secondary | ICD-10-CM | POA: Diagnosis not present

## 2016-06-28 DIAGNOSIS — G4700 Insomnia, unspecified: Secondary | ICD-10-CM | POA: Diagnosis not present

## 2016-06-28 DIAGNOSIS — F1010 Alcohol abuse, uncomplicated: Secondary | ICD-10-CM | POA: Diagnosis not present

## 2016-07-15 DIAGNOSIS — F1010 Alcohol abuse, uncomplicated: Secondary | ICD-10-CM | POA: Diagnosis not present

## 2016-07-15 DIAGNOSIS — F419 Anxiety disorder, unspecified: Secondary | ICD-10-CM | POA: Diagnosis not present

## 2016-07-15 DIAGNOSIS — G4700 Insomnia, unspecified: Secondary | ICD-10-CM | POA: Diagnosis not present

## 2016-07-15 DIAGNOSIS — F329 Major depressive disorder, single episode, unspecified: Secondary | ICD-10-CM | POA: Diagnosis not present

## 2016-07-25 ENCOUNTER — Other Ambulatory Visit: Payer: Self-pay | Admitting: Internal Medicine

## 2016-08-19 ENCOUNTER — Ambulatory Visit (INDEPENDENT_AMBULATORY_CARE_PROVIDER_SITE_OTHER): Payer: BLUE CROSS/BLUE SHIELD | Admitting: Internal Medicine

## 2016-08-19 ENCOUNTER — Encounter: Payer: Self-pay | Admitting: Internal Medicine

## 2016-08-19 VITALS — BP 116/78 | HR 100 | Ht 64.5 in | Wt 146.0 lb

## 2016-08-19 DIAGNOSIS — Z1239 Encounter for other screening for malignant neoplasm of breast: Secondary | ICD-10-CM

## 2016-08-19 DIAGNOSIS — R87610 Atypical squamous cells of undetermined significance on cytologic smear of cervix (ASC-US): Secondary | ICD-10-CM | POA: Diagnosis not present

## 2016-08-19 DIAGNOSIS — K2100 Gastro-esophageal reflux disease with esophagitis, without bleeding: Secondary | ICD-10-CM

## 2016-08-19 DIAGNOSIS — F324 Major depressive disorder, single episode, in partial remission: Secondary | ICD-10-CM | POA: Diagnosis not present

## 2016-08-19 DIAGNOSIS — F17200 Nicotine dependence, unspecified, uncomplicated: Secondary | ICD-10-CM | POA: Diagnosis not present

## 2016-08-19 DIAGNOSIS — E782 Mixed hyperlipidemia: Secondary | ICD-10-CM

## 2016-08-19 DIAGNOSIS — K591 Functional diarrhea: Secondary | ICD-10-CM | POA: Diagnosis not present

## 2016-08-19 DIAGNOSIS — E559 Vitamin D deficiency, unspecified: Secondary | ICD-10-CM

## 2016-08-19 DIAGNOSIS — I10 Essential (primary) hypertension: Secondary | ICD-10-CM | POA: Diagnosis not present

## 2016-08-19 DIAGNOSIS — Z0000 Encounter for general adult medical examination without abnormal findings: Secondary | ICD-10-CM | POA: Diagnosis not present

## 2016-08-19 DIAGNOSIS — Z8541 Personal history of malignant neoplasm of cervix uteri: Secondary | ICD-10-CM | POA: Diagnosis not present

## 2016-08-19 LAB — POCT URINALYSIS DIPSTICK
Bilirubin, UA: NEGATIVE
Glucose, UA: NEGATIVE
Ketones, UA: NEGATIVE
Nitrite, UA: POSITIVE
Protein, UA: NEGATIVE
Spec Grav, UA: 1.02 (ref 1.010–1.025)
Urobilinogen, UA: 0.2 E.U./dL
pH, UA: 5 (ref 5.0–8.0)

## 2016-08-19 MED ORDER — PANTOPRAZOLE SODIUM 40 MG PO TBEC: 40.0000 mg | DELAYED_RELEASE_TABLET | Freq: Every day | ORAL | 3 refills | Status: DC

## 2016-08-19 NOTE — Patient Instructions (Signed)

## 2016-08-19 NOTE — Progress Notes (Signed)
Date:  08/19/2016   Name:  Haley Mejia   DOB:  [birth year]   MRN:  [id number]   Chief Complaint: Annual Exam (Pap smear, and breast exam. ) Haley Mejia is a 51 y.o. female who presents today for her Complete Annual Exam. She feels fairly well. She reports exercising none. She reports she is sleeping well. She has remote hx of cervical cancer and s/p conization - due for Pap (last one about 3 years ago was normal). She has never had a mammogram.  Hypertension  The problem is controlled. Associated symptoms include anxiety. Pertinent negatives include no chest pain, headaches, palpitations or shortness of breath. Past treatments include ACE inhibitors and diuretics.  Hyperlipidemia  This is a chronic problem. The problem is controlled. Pertinent negatives include no chest pain or shortness of breath. Current antihyperlipidemic treatment includes statins.  Anxiety  Presents for follow-up (now seeing psychiatrist) visit. Symptoms include depressed mood, insomnia and nervous/anxious behavior. Patient reports no chest pain, dizziness, palpitations or shortness of breath. The quality of sleep is good.   Compliance with medications is 76-100% (trazodone; stopped sertraline and started cymbalta).  Gastroesophageal Reflux  She reports no abdominal pain, no chest pain, no coughing or no wheezing. This is a chronic problem. The problem occurs rarely. Pertinent negatives include no fatigue. She has tried a PPI for the symptoms.     Review of Systems  Constitutional: Negative for chills, fatigue and fever.  HENT: Negative for congestion, hearing loss, tinnitus, trouble swallowing and voice change.   Eyes: Negative for visual disturbance.  Respiratory: Negative for cough, chest tightness, shortness of breath and wheezing.   Cardiovascular: Negative for chest pain, palpitations and leg swelling.  Gastrointestinal: Negative for abdominal pain, constipation, diarrhea and vomiting.  Endocrine: Negative  for polydipsia and polyuria.  Genitourinary: Negative for dysuria, frequency, genital sores, vaginal bleeding and vaginal discharge.  Musculoskeletal: Negative for arthralgias, gait problem and joint swelling.  Skin: Negative for color change and rash.  Neurological: Negative for dizziness, tremors, light-headedness and headaches.  Hematological: Negative for adenopathy. Does not bruise/bleed easily.  Psychiatric/Behavioral: Negative for dysphoric mood and sleep disturbance. The patient is nervous/anxious and has insomnia.     Patient Active Problem List   Diagnosis Date Noted  . Adenomatous colon polyp 10/23/2015  . Tobacco use disorder 10/23/2015  . Gastritis determined by endoscopy 10/23/2015  . Cephalalgia 10/23/2015  . Functional diarrhea 10/23/2015  . Insomnia 10/23/2015  . Chest pain with high risk for cardiac etiology 10/23/2015  . Vitamin D deficiency 06/09/2015  . Mixed hyperlipidemia 06/09/2015  . Esophageal stricture 06/08/2015  . Overweight (BMI 25.0-29.9) 06/08/2015  . Anxiety disorder 05/29/2014  . History of cervical cancer 05/29/2014  . Depression 05/29/2014  . Essential hypertension 05/29/2014    Prior to Admission medications   Medication Sig Start Date End Date Taking? Authorizing Provider  Cholecalciferol (VITAMIN D-1000 MAX ST) 1000 units tablet Take by mouth.    [provider]  lisinopril-hydrochlorothiazide (PRINZIDE,ZESTORETIC) 10-12.5 MG tablet TAKE 1 TABLET BY MOUTH DAILY. 07/25/16   Glean Hess, MD  pantoprazole (PROTONIX) 40 MG tablet TAKE 1 TABLET (40 MG TOTAL) BY MOUTH DAILY. 04/30/16   Glean Hess, MD  pravastatin (PRAVACHOL) 40 MG tablet TAKE 1 TABLET (40 MG TOTAL) BY MOUTH DAILY. 02/23/16   Glean Hess, MD  Pseudoephedrine-APAP-DM (DAYQUIL PO) Take by mouth.    [provider]  sertraline (ZOLOFT) 100 MG tablet Take by mouth.    [provider]  traZODone (DESYREL) 50 MG tablet Take 1 tablet (50 mg total)  by mouth at bedtime as needed for sleep. 03/21/16   Glean Hess, MD    No Known Allergies  Past Surgical History:  Procedure Laterality Date  . CERVICAL CONIZATION W/BX  1993   for cervical cancer    Social History  Substance Use Topics  . Smoking status: Current Every Day Smoker    Packs/day: 0.25    Types: Cigarettes  . Smokeless tobacco: Never Used  . Alcohol use 4.2 oz/week    7 Glasses of wine per week     Comment: 1-2 glasses of wine a night   Urine dipstick shows positive for nitrates and positive for leukocytes.  Micro exam: 3+ bacteria.   Medication list has been reviewed and updated.   Physical Exam  Constitutional: She is oriented to person, place, and time. She appears well-developed and well-nourished. No distress.  HENT:  Head: Normocephalic and atraumatic.  Right Ear: Tympanic membrane and ear canal normal.  Left Ear: Tympanic membrane and ear canal normal.  Nose: Right sinus exhibits no maxillary sinus tenderness. Left sinus exhibits no maxillary sinus tenderness.  Mouth/Throat: Uvula is midline and oropharynx is clear and moist.  Eyes: Conjunctivae and EOM are normal. Right eye exhibits no discharge. Left eye exhibits no discharge. No scleral icterus.  Neck: Normal range of motion. Carotid bruit is not present. No erythema present. No thyromegaly present.  Cardiovascular: Normal rate, regular rhythm, normal heart sounds and normal pulses.   Pulmonary/Chest: Effort normal. No respiratory distress. She has no wheezes. Right breast exhibits no mass, no nipple discharge, no skin change and no tenderness. Left breast exhibits no mass, no nipple discharge, no skin change and no tenderness.  Abdominal: Soft. Bowel sounds are normal. There is no hepatosplenomegaly. There is no tenderness. There is no CVA tenderness.  Genitourinary: Vagina normal and uterus normal. There is no tenderness, lesion or injury on the right labia. There is no tenderness, lesion or  injury on the left labia. Cervix exhibits no motion tenderness, no discharge and no friability. Right adnexum displays no mass, no tenderness and no fullness. Left adnexum displays no mass, no tenderness and no fullness.  Musculoskeletal: Normal range of motion.  Lymphadenopathy:    She has no cervical adenopathy.    She has no axillary adenopathy.  Neurological: She is alert and oriented to person, place, and time. She has normal reflexes. No cranial nerve deficit or sensory deficit.  Skin: Skin is warm, dry and intact. No rash noted.  Psychiatric: She has a normal mood and affect. Her speech is normal and behavior is normal. Thought content normal.  Nursing note and vitals reviewed.   BP 116/78   Pulse 100   Ht 5' 4.5" (1.638 m)   Wt 146 lb (66.2 kg)   SpO2 96%   BMI 24.67 kg/m   Assessment and Plan: 1. Annual physical exam Discussed regular exercise and smoking cessation UA positive for bacteria - no treatment since asymptomatic - POCT urinalysis dipstick  2. Breast cancer screening - MM DIGITAL SCREENING BILATERAL; Future  3. Essential hypertension controlled - CBC with Differential/Platelet - Comprehensive metabolic panel - TSH  4. Functional diarrhea Colonoscopy was normal Sounds like IBS-D  5. Mixed hyperlipidemia On statin therapy - Lipid panel  6. Gastroesophageal reflux disease with esophagitis Controlled by PPI - pantoprazole (PROTONIX) 40 MG tablet; Take 1 tablet (40 mg total) by mouth daily.  Dispense: 90 tablet;  Refill: 3  7. Tobacco use disorder Pt is trying to cut back but not highly motivated at this time  8. Vitamin D deficiency Recommend resuming supplement  9. Hx of cervical cancer - Pap IG (Image Guided)  10. Major depressive disorder with single episode, in partial remission [facility]) Seeing Psychiatry Doing well on cymbalta and trazodone  Meds ordered this encounter  Medications  . pantoprazole (PROTONIX) 40 MG tablet    Sig: Take 1  tablet (40 mg total) by mouth daily.    Dispense:  90 tablet    Refill:  [place], MD [facility]  08/19/2016

## 2016-08-20 LAB — LIPID PANEL
Chol/HDL Ratio: 3.5 ratio (ref 0.0–4.4)
Cholesterol, Total: 159 mg/dL (ref 100–199)
HDL: 45 mg/dL (ref >39)
LDL Calculated: 79 mg/dL (ref 0–99)
Triglycerides: 173 mg/dL — ABNORMAL HIGH (ref 0–149)
VLDL Cholesterol Cal: 35 mg/dL (ref 5–40)

## 2016-08-20 LAB — CBC WITH DIFFERENTIAL/PLATELET
Basophils Absolute: 0 10*3/uL (ref 0.0–0.2)
Basos: 1 %
EOS (ABSOLUTE): 0.1 10*3/uL (ref 0.0–0.4)
Eos: 2 %
Hematocrit: 43.6 % (ref 34.0–46.6)
Hemoglobin: 15 g/dL (ref 11.1–15.9)
Immature Grans (Abs): 0 10*3/uL (ref 0.0–0.1)
Immature Granulocytes: 0 %
Lymphocytes Absolute: 2 10*3/uL (ref 0.7–3.1)
Lymphs: 40 %
MCH: 34.6 pg — ABNORMAL HIGH (ref 26.6–33.0)
MCHC: 34.4 g/dL (ref 31.5–35.7)
MCV: 101 fL — ABNORMAL HIGH (ref 79–97)
Monocytes Absolute: 0.4 10*3/uL (ref 0.1–0.9)
Monocytes: 7 %
Neutrophils Absolute: 2.5 10*3/uL (ref 1.4–7.0)
Neutrophils: 50 %
Platelets: 355 10*3/uL (ref 150–379)
RBC: 4.34 x10E6/uL (ref 3.77–5.28)
RDW: 13 % (ref 12.3–15.4)
WBC: 5 10*3/uL (ref 3.4–10.8)

## 2016-08-20 LAB — COMPREHENSIVE METABOLIC PANEL
ALT: 55 IU/L — ABNORMAL HIGH (ref 0–32)
AST: 41 IU/L — ABNORMAL HIGH (ref 0–40)
Albumin/Globulin Ratio: 1.6 (ref 1.2–2.2)
Albumin: 4.6 g/dL (ref 3.5–5.5)
Alkaline Phosphatase: 59 IU/L (ref 39–117)
BUN/Creatinine Ratio: 23 (ref 9–23)
BUN: 17 mg/dL (ref 6–24)
Bilirubin Total: <0.2 mg/dL (ref 0.0–1.2)
CO2: 23 mmol/L (ref 20–29)
Calcium: 9.6 mg/dL (ref 8.7–10.2)
Chloride: 104 mmol/L (ref 96–106)
Creatinine, Ser: 0.74 mg/dL (ref 0.57–1.00)
GFR calc Af Amer: 107 mL/min/{1.73_m2} (ref >59)
GFR calc non Af Amer: 93 mL/min/{1.73_m2} (ref >59)
Globulin, Total: 2.8 g/dL (ref 1.5–4.5)
Glucose: 86 mg/dL (ref 65–99)
Potassium: 4.4 mmol/L (ref 3.5–5.2)
Sodium: 142 mmol/L (ref 134–144)
Total Protein: 7.4 g/dL (ref 6.0–8.5)

## 2016-08-20 LAB — TSH: TSH: 0.868 u[IU]/mL (ref 0.450–4.500)

## 2016-08-22 ENCOUNTER — Telehealth: Payer: Self-pay

## 2016-08-22 NOTE — Telephone Encounter (Signed)
Pt informed of better cholesterol- liver tests unchanged since last year- other labs normal. No questions were asked during call.

## 2016-08-23 LAB — PAP IG (IMAGE GUIDED): PAP Smear Comment: 0

## 2016-10-18 ENCOUNTER — Other Ambulatory Visit: Payer: Self-pay | Admitting: Internal Medicine

## 2016-10-18 DIAGNOSIS — F5101 Primary insomnia: Secondary | ICD-10-CM

## 2016-12-22 DIAGNOSIS — F1010 Alcohol abuse, uncomplicated: Secondary | ICD-10-CM | POA: Diagnosis not present

## 2016-12-22 DIAGNOSIS — F419 Anxiety disorder, unspecified: Secondary | ICD-10-CM | POA: Diagnosis not present

## 2016-12-22 DIAGNOSIS — G4700 Insomnia, unspecified: Secondary | ICD-10-CM | POA: Diagnosis not present

## 2016-12-22 DIAGNOSIS — F329 Major depressive disorder, single episode, unspecified: Secondary | ICD-10-CM | POA: Diagnosis not present

## 2017-01-12 ENCOUNTER — Encounter: Payer: BLUE CROSS/BLUE SHIELD | Admitting: Internal Medicine

## 2017-02-24 ENCOUNTER — Ambulatory Visit: Payer: PRIVATE HEALTH INSURANCE | Admitting: Internal Medicine

## 2017-02-24 ENCOUNTER — Encounter: Payer: Self-pay | Admitting: Internal Medicine

## 2017-02-24 VITALS — BP 102/68 | HR 64 | Ht 64.5 in | Wt 151.0 lb

## 2017-02-24 DIAGNOSIS — E559 Vitamin D deficiency, unspecified: Secondary | ICD-10-CM | POA: Diagnosis not present

## 2017-02-24 DIAGNOSIS — Z23 Encounter for immunization: Secondary | ICD-10-CM

## 2017-02-24 DIAGNOSIS — I10 Essential (primary) hypertension: Secondary | ICD-10-CM

## 2017-02-24 DIAGNOSIS — R87610 Atypical squamous cells of undetermined significance on cytologic smear of cervix (ASC-US): Secondary | ICD-10-CM

## 2017-02-24 MED ORDER — VITAMIN D (ERGOCALCIFEROL) 1.25 MG (50000 UNIT) PO CAPS: 50000.0000 [IU] | ORAL_CAPSULE | ORAL | 3 refills | Status: DC

## 2017-02-24 MED ORDER — PRAVASTATIN SODIUM 40 MG PO TABS: 40.0000 mg | ORAL_TABLET | Freq: Every day | ORAL | 3 refills | Status: DC

## 2017-02-24 MED ORDER — LISINOPRIL-HYDROCHLOROTHIAZIDE 10-12.5 MG PO TABS: 1.0000 | ORAL_TABLET | Freq: Every day | ORAL | 3 refills | Status: DC

## 2017-02-24 NOTE — Progress Notes (Signed)
Date:  02/24/2017   Name:  Haley Mejia   DOB:  28-Sep-1965   MRN:  [id number]   Chief Complaint: Hypertension; Abnormal Pap Smear; and vit d def (Wants prescribed VIT D. ) Hypertension  This is a chronic problem. The problem is controlled. Pertinent negatives include no chest pain, palpitations or shortness of breath. Past treatments include ACE inhibitors. The current treatment provides significant improvement.   ASCUS pap - noted 6 mnths ago.  Here for repeat today. She has no complaints with vaginal pain, bleeding or discharge.  Vitamin D def - not taking supplements as instructed.  Would like the high dose once a week.  Review of Systems  Constitutional: Negative for diaphoresis, fever and unexpected weight change.  Respiratory: Negative for shortness of breath.   Cardiovascular: Negative for chest pain and palpitations.  Gastrointestinal: Negative for abdominal pain.  Genitourinary: Negative for hematuria, menstrual problem, vaginal bleeding and vaginal discharge.  Musculoskeletal: Negative for arthralgias and gait problem.  Skin: Negative for color change and rash.    Patient Active Problem List   Diagnosis Date Noted  . Adenomatous colon polyp 10/23/2015  . Tobacco use disorder 10/23/2015  . Gastritis determined by endoscopy 10/23/2015  . Functional diarrhea 10/23/2015  . Insomnia 10/23/2015  . Chest pain with high risk for cardiac etiology 10/23/2015  . Vitamin D deficiency 06/09/2015  . Mixed hyperlipidemia 06/09/2015  . Esophageal stricture 06/08/2015  . Overweight (BMI 25.0-29.9) 06/08/2015  . Anxiety disorder 05/29/2014  . History of cervical cancer 05/29/2014  . Depression 05/29/2014  . Essential hypertension 05/29/2014    Prior to Admission medications   Medication Sig Start Date End Date Taking? Authorizing Provider  DULoxetine (CYMBALTA) 60 MG capsule Take 60 mg by mouth daily.   Yes [provider]  lisinopril-hydrochlorothiazide  (PRINZIDE,ZESTORETIC) 10-12.5 MG tablet TAKE 1 TABLET BY MOUTH DAILY. 07/25/16  Yes Glean Hess, MD  pantoprazole (PROTONIX) 40 MG tablet Take 1 tablet (40 mg total) by mouth daily. 08/19/16  Yes Glean Hess, MD  pravastatin (PRAVACHOL) 40 MG tablet TAKE 1 TABLET (40 MG TOTAL) BY MOUTH DAILY. 02/23/16  Yes Glean Hess, MD  traZODone (DESYREL) 50 MG tablet TAKE 1 TABLET (50 MG TOTAL) BY MOUTH AT BEDTIME AS NEEDED FOR SLEEP. 10/18/16  Yes Glean Hess, MD  Cholecalciferol (VITAMIN D-1000 MAX ST) 1000 units tablet Take by mouth.    [provider]    No Known Allergies  Past Surgical History:  Procedure Laterality Date  . CERVICAL CONIZATION W/BX  1993   for cervical cancer    Social History   Tobacco Use  . Smoking status: Current Every Day Smoker    Packs/day: 0.25    Types: Cigarettes  . Smokeless tobacco: Never Used  Substance Use Topics  . Alcohol use: Yes    Alcohol/week: 4.2 oz    Types: 7 Glasses of wine per week    Comment: 1-2 glasses of wine a night  . Drug use: No     Medication list has been reviewed and updated.  PHQ 2/9 Scores 02/24/2017 08/19/2016 06/08/2015  PHQ - 2 Score 0 0 0  PHQ- 9 Score 0 - -    Physical Exam  Constitutional: She is oriented to person, place, and time.  Cardiovascular: Normal rate, regular rhythm, normal heart sounds and intact distal pulses.  Pulmonary/Chest: Effort normal. She has no decreased breath sounds. She has no wheezes.  Genitourinary: Vagina normal and uterus normal. There is  no tenderness, lesion or injury on the right labia. There is no tenderness, lesion or injury on the left labia. Cervix exhibits discharge. Cervix exhibits no motion tenderness and no friability. Right adnexum displays no mass, no tenderness and no fullness. Left adnexum displays no mass, no tenderness and no fullness.  Musculoskeletal: She exhibits no edema.  Neurological: She is alert and oriented to person, place, and time.    Psychiatric: She has a normal mood and affect.    BP 102/68   Pulse 64   Ht 5' 4.5" (1.638 m)   Wt 151 lb (68.5 kg)   SpO2 97%   BMI 25.52 kg/m   Assessment and Plan: 1. Essential hypertension controlled - lisinopril-hydrochlorothiazide (PRINZIDE,ZESTORETIC) 10-12.5 MG tablet; Take 1 tablet by mouth daily.  Dispense: 90 tablet; Refill: 3  2. Atypical squamous cells of undetermined significance (ASCUS) on Papanicolaou smear of cervix Pap repeated today - Pap IG and HPV (high risk) DNA detection  3. Vitamin D deficiency Begin weekly  - Vitamin D, Ergocalciferol, (DRISDOL) 50000 units CAPS capsule; Take 1 capsule (50,000 Units total) by mouth every 7 (seven) days.  Dispense: 12 capsule; Refill: 3  4. Need for influenza vaccination - Flu Vaccine QUAD 36+ mos IM   Meds ordered this encounter  Medications  . Vitamin D, Ergocalciferol, (DRISDOL) 50000 units CAPS capsule    Sig: Take 1 capsule (50,000 Units total) by mouth every 7 (seven) days.    Dispense:  12 capsule    Refill:  3  . pravastatin (PRAVACHOL) 40 MG tablet    Sig: Take 1 tablet (40 mg total) by mouth daily.    Dispense:  90 tablet    Refill:  3  . lisinopril-hydrochlorothiazide (PRINZIDE,ZESTORETIC) 10-12.5 MG tablet    Sig: Take 1 tablet by mouth daily.    Dispense:  90 tablet    Refill:  3    Partially dictated using Editor, commissioning. Any errors are unintentional.  Halina Maidens, MD [facility]  02/24/2017

## 2017-02-28 LAB — PAP IG AND HPV HIGH-RISK
HPV, high-risk: POSITIVE — AB
PAP Smear Comment: 0

## 2017-03-03 ENCOUNTER — Other Ambulatory Visit: Payer: Self-pay | Admitting: Internal Medicine

## 2017-03-03 DIAGNOSIS — R87610 Atypical squamous cells of undetermined significance on cytologic smear of cervix (ASC-US): Secondary | ICD-10-CM | POA: Insufficient documentation

## 2017-03-03 DIAGNOSIS — R87810 Cervical high risk human papillomavirus (HPV) DNA test positive: Principal | ICD-10-CM

## 2017-03-03 NOTE — Progress Notes (Signed)
Patient informed of abnormal pap cells, with Positive HPV. Has no preference on where she goes for Gyn. Would like referral placed. She lives in [place].

## 2017-03-03 NOTE — Progress Notes (Unsigned)
ob

## 2017-04-28 LAB — HM PAP SMEAR: HM Pap smear: NORMAL

## 2017-05-24 ENCOUNTER — Other Ambulatory Visit: Payer: Self-pay | Admitting: Internal Medicine

## 2017-05-24 DIAGNOSIS — F5101 Primary insomnia: Secondary | ICD-10-CM

## 2017-07-08 LAB — HM PAP SMEAR: HM Pap smear: NORMAL

## 2017-08-25 ENCOUNTER — Encounter: Payer: PRIVATE HEALTH INSURANCE | Admitting: Internal Medicine

## 2017-08-30 ENCOUNTER — Other Ambulatory Visit: Payer: Self-pay | Admitting: Internal Medicine

## 2017-08-30 DIAGNOSIS — K2100 Gastro-esophageal reflux disease with esophagitis, without bleeding: Secondary | ICD-10-CM

## 2017-11-27 ENCOUNTER — Other Ambulatory Visit: Payer: Self-pay | Admitting: Internal Medicine

## 2017-11-27 DIAGNOSIS — K2100 Gastro-esophageal reflux disease with esophagitis, without bleeding: Secondary | ICD-10-CM

## 2017-12-15 ENCOUNTER — Ambulatory Visit (INDEPENDENT_AMBULATORY_CARE_PROVIDER_SITE_OTHER): Payer: PRIVATE HEALTH INSURANCE | Admitting: Internal Medicine

## 2017-12-15 ENCOUNTER — Other Ambulatory Visit: Payer: Self-pay | Admitting: Internal Medicine

## 2017-12-15 ENCOUNTER — Encounter: Payer: Self-pay | Admitting: Internal Medicine

## 2017-12-15 VITALS — BP 121/78 | HR 75 | Resp 16 | Ht 64.0 in | Wt 155.0 lb

## 2017-12-15 DIAGNOSIS — F324 Major depressive disorder, single episode, in partial remission: Secondary | ICD-10-CM

## 2017-12-15 DIAGNOSIS — F17200 Nicotine dependence, unspecified, uncomplicated: Secondary | ICD-10-CM

## 2017-12-15 DIAGNOSIS — F411 Generalized anxiety disorder: Secondary | ICD-10-CM

## 2017-12-15 DIAGNOSIS — R05 Cough: Secondary | ICD-10-CM | POA: Diagnosis not present

## 2017-12-15 DIAGNOSIS — R059 Cough, unspecified: Secondary | ICD-10-CM

## 2017-12-15 MED ORDER — SERTRALINE HCL 100 MG PO TABS: 100.0000 mg | ORAL_TABLET | Freq: Every day | ORAL | 3 refills | Status: DC

## 2017-12-15 NOTE — Progress Notes (Signed)
Date:  12/15/2017   Name:  Meyer Dockery   DOB:  04-21-1965   MRN:  [id number]   Chief Complaint: Depression (Seeing Psych and therapist and therapist wanst psych to change medication to Zoloft but Psych said no. Therapy said with Extreme Anxiety Cymbalta is not the best Rx but Psych will not change. Wants to see what PCP thinks and is not scheduled to go back due to insurance. ) and Cough (Cough has lingered for a few months. Comes on strong and then after fluids and cough drops it eases off but comes abck. )  Depression       The patient presents with depression.  This is a chronic problem.  The problem has been gradually worsening since onset.  Associated symptoms include no fatigue, no headaches and no suicidal ideas.  Past treatments include SNRIs - Serotonin and norepinephrine reuptake inhibitors.  Compliance with treatment is good.  Previous treatment provided moderate (but having more anxiety.  Previously on Zoloft and did fairly well) relief.  Past medical history includes depression.     Pertinent negatives include no chronic illness, no physical disability and no suicide attempts. Cough  This is a new problem. The current episode started more than 1 month ago. The problem has been unchanged. The problem occurs every few minutes. The cough is non-productive. Associated symptoms include postnasal drip. Pertinent negatives include no chest pain, chills, ear pain, fever, headaches, rash, shortness of breath or wheezing. The symptoms are aggravated by lying down. Risk factors for lung disease include smoking/tobacco exposure. She has tried nothing for the symptoms. There is no history of environmental allergies.    Review of Systems  Constitutional: Negative for chills, fatigue and fever.  HENT: Positive for postnasal drip. Negative for congestion, dental problem, ear pain, sinus pressure and trouble swallowing.   Respiratory: Positive for cough. Negative for choking, chest tightness,  shortness of breath and wheezing.   Cardiovascular: Negative for chest pain and palpitations.  Skin: Negative for color change and rash.  Allergic/Immunologic: Negative for environmental allergies.  Neurological: Negative for dizziness, numbness and headaches.  Psychiatric/Behavioral: Positive for depression and dysphoric mood. Negative for sleep disturbance and suicidal ideas. The patient is nervous/anxious.     Patient Active Problem List   Diagnosis Date Noted  . ASCUS with positive high risk HPV cervical 03/03/2017  . Adenomatous colon polyp 10/23/2015  . Tobacco use disorder 10/23/2015  . Gastritis determined by endoscopy 10/23/2015  . Functional diarrhea 10/23/2015  . Insomnia 10/23/2015  . Vitamin D deficiency 06/09/2015  . Mixed hyperlipidemia 06/09/2015  . Esophageal stricture 06/08/2015  . Overweight (BMI 25.0-29.9) 06/08/2015  . Anxiety disorder 05/29/2014  . History of cervical cancer 05/29/2014  . Depression 05/29/2014  . Essential hypertension 05/29/2014    No Known Allergies  Past Surgical History:  Procedure Laterality Date  . CERVICAL CONIZATION W/BX  1993   for cervical cancer    Social History   Tobacco Use  . Smoking status: Current Every Day Smoker    Packs/day: 0.25    Types: Cigarettes  . Smokeless tobacco: Never Used  Substance Use Topics  . Alcohol use: Yes    Alcohol/week: 7.0 standard drinks    Types: 7 Glasses of wine per week    Comment: 1-2 glasses of wine a night  . Drug use: No     Medication list has been reviewed and updated.  Current Meds  Medication Sig  . Cholecalciferol (VITAMIN D-1000 MAX ST) 1000  units tablet Take by mouth.  . DULoxetine (CYMBALTA) 60 MG capsule Take 60 mg by mouth daily.  Marland Kitchen lisinopril-hydrochlorothiazide (PRINZIDE,ZESTORETIC) 10-12.5 MG tablet Take 1 tablet by mouth daily.  . naproxen (NAPROSYN) 500 MG tablet   . pantoprazole (PROTONIX) 40 MG tablet TAKE 1 TABLET BY MOUTH EVERY DAY (NEED APPT FOR  FURTHER REFILLS)  . pravastatin (PRAVACHOL) 40 MG tablet Take 1 tablet (40 mg total) by mouth daily.  . traZODone (DESYREL) 50 MG tablet TAKE 1 TABLET (50 MG TOTAL) BY MOUTH AT BEDTIME AS NEEDED FOR SLEEP.    PHQ 2/9 Scores 12/15/2017 02/24/2017 08/19/2016 06/08/2015  PHQ - 2 Score 0 0 0 0  PHQ- 9 Score 3 0 - -    Physical Exam  Constitutional: She is oriented to person, place, and time. She appears well-developed. No distress.  HENT:  Head: Normocephalic and atraumatic.  Right Ear: Ear canal normal. Tympanic membrane is perforated. Tympanic membrane is not erythematous.  Left Ear: Ear canal normal. Tympanic membrane is perforated. Tympanic membrane is not erythematous.  Nose: Right sinus exhibits no maxillary sinus tenderness and no frontal sinus tenderness. Left sinus exhibits no maxillary sinus tenderness and no frontal sinus tenderness.  Mouth/Throat: No posterior oropharyngeal edema or posterior oropharyngeal erythema.  Neck: Normal range of motion. Neck supple.  Cardiovascular: Normal rate, regular rhythm and normal heart sounds.  Pulmonary/Chest: Effort normal and breath sounds normal. No accessory muscle usage. No respiratory distress. She has no decreased breath sounds. She has no wheezes. She has no rhonchi.  Musculoskeletal: Normal range of motion.  Lymphadenopathy:    She has no cervical adenopathy.  Neurological: She is alert and oriented to person, place, and time.  Skin: Skin is warm and dry. No rash noted.  Psychiatric: She has a normal mood and affect. Her behavior is normal. Thought content normal.  Nursing note and vitals reviewed.   BP 121/78   Pulse 75   Resp 16   Ht 5\' 4"  (1.626 m)   Wt 155 lb (70.3 kg)   SpO2 100%   BMI 26.61 kg/m   Assessment and Plan: 1. Major depressive disorder with single episode, in partial remission ([place]) Will stop Cymbalta and begin Zoloft Continue follow up with Therapist weekly and with me in 6 weeks - sertraline (ZOLOFT) 100 MG  tablet; Take 1 tablet (100 mg total) by mouth daily.  Dispense: 30 tablet; Refill: 3  2. Cough in adult claritin or allegra daily If persistent, will need further evaluation  3. Generalized anxiety disorder  4. Tobacco use disorder Work on cutting back Does not qualify for LDCT   Partially dictated using Editor, commissioning. Any errors are unintentional.  Halina Maidens, MD [facility]  12/15/2017

## 2017-12-15 NOTE — Patient Instructions (Signed)
Claritin or Allegra daily for several weeks

## 2018-01-06 ENCOUNTER — Other Ambulatory Visit: Payer: Self-pay | Admitting: Internal Medicine

## 2018-01-06 ENCOUNTER — Encounter: Payer: Self-pay | Admitting: Internal Medicine

## 2018-01-06 DIAGNOSIS — F324 Major depressive disorder, single episode, in partial remission: Secondary | ICD-10-CM

## 2018-01-12 ENCOUNTER — Ambulatory Visit: Payer: PRIVATE HEALTH INSURANCE | Admitting: Internal Medicine

## 2018-01-24 ENCOUNTER — Other Ambulatory Visit: Payer: Self-pay | Admitting: Internal Medicine

## 2018-01-24 DIAGNOSIS — E559 Vitamin D deficiency, unspecified: Secondary | ICD-10-CM

## 2018-02-23 ENCOUNTER — Ambulatory Visit: Payer: BLUE CROSS/BLUE SHIELD | Admitting: Internal Medicine

## 2018-02-23 ENCOUNTER — Encounter: Payer: Self-pay | Admitting: Internal Medicine

## 2018-02-23 VITALS — BP 128/76 | HR 75 | Ht 64.5 in | Wt 156.6 lb

## 2018-02-23 DIAGNOSIS — Z1231 Encounter for screening mammogram for malignant neoplasm of breast: Secondary | ICD-10-CM

## 2018-02-23 DIAGNOSIS — K2100 Gastro-esophageal reflux disease with esophagitis, without bleeding: Secondary | ICD-10-CM

## 2018-02-23 DIAGNOSIS — F324 Major depressive disorder, single episode, in partial remission: Secondary | ICD-10-CM | POA: Diagnosis not present

## 2018-02-23 DIAGNOSIS — Z23 Encounter for immunization: Secondary | ICD-10-CM

## 2018-02-23 DIAGNOSIS — E782 Mixed hyperlipidemia: Secondary | ICD-10-CM | POA: Diagnosis not present

## 2018-02-23 DIAGNOSIS — I10 Essential (primary) hypertension: Secondary | ICD-10-CM | POA: Diagnosis not present

## 2018-02-23 DIAGNOSIS — F5101 Primary insomnia: Secondary | ICD-10-CM

## 2018-02-23 MED ORDER — PRAVASTATIN SODIUM 40 MG PO TABS: 40.0000 mg | ORAL_TABLET | Freq: Every day | ORAL | 3 refills | Status: DC

## 2018-02-23 MED ORDER — TRAZODONE HCL 50 MG PO TABS: 50.0000 mg | ORAL_TABLET | Freq: Every evening | ORAL | 1 refills | Status: DC | PRN

## 2018-02-23 MED ORDER — LISINOPRIL-HYDROCHLOROTHIAZIDE 10-12.5 MG PO TABS
1.0000 | ORAL_TABLET | Freq: Every day | ORAL | 3 refills | Status: DC
Start: 2018-02-23 — End: 2019-02-21

## 2018-02-23 MED ORDER — SERTRALINE HCL 100 MG PO TABS: 100.0000 mg | ORAL_TABLET | Freq: Every day | ORAL | 1 refills | Status: DC

## 2018-02-23 MED ORDER — PANTOPRAZOLE SODIUM 40 MG PO TBEC: 40.0000 mg | DELAYED_RELEASE_TABLET | Freq: Every day | ORAL | 1 refills | Status: DC

## 2018-02-23 NOTE — Progress Notes (Signed)
Date:  02/23/2018   Name:  Haley Mejia   DOB:  1965/06/20   MRN:  [id number]   Chief Complaint: Depression (Follow up with Sertaline medications. PHQ9- 10)  Depression         This is a chronic problem.  The onset quality is undetermined.   The problem has been gradually improving since onset.  Associated symptoms include no decreased concentration and no fatigue.  Past treatments include SSRIs - Selective serotonin reuptake inhibitors (the change from cymbalta to sertraline has helped reduce anxiety).  Compliance with treatment is good.  Previous treatment provided significant relief. Hypertension  This is a chronic problem. The problem is controlled. Pertinent negatives include no chest pain, palpitations or shortness of breath. Past treatments include ACE inhibitors and diuretics.   She would like to get Shingrix.  She has never had a mammogram but would do one this year.  She denies any breast issues.  Review of Systems  Constitutional: Negative for chills, fatigue, fever and unexpected weight change.  Respiratory: Negative for cough, chest tightness, shortness of breath and wheezing.   Cardiovascular: Negative for chest pain, palpitations and leg swelling.  Gastrointestinal: Negative for abdominal pain, constipation, diarrhea and nausea.  Psychiatric/Behavioral: Positive for depression and dysphoric mood. Negative for decreased concentration and sleep disturbance. The patient is not nervous/anxious.     Patient Active Problem List   Diagnosis Date Noted  . ASCUS with positive high risk HPV cervical 03/03/2017  . Adenomatous colon polyp 10/23/2015  . Tobacco use disorder 10/23/2015  . Gastritis determined by endoscopy 10/23/2015  . Functional diarrhea 10/23/2015  . Insomnia 10/23/2015  . Vitamin D deficiency 06/09/2015  . Mixed hyperlipidemia 06/09/2015  . Esophageal stricture 06/08/2015  . Overweight (BMI 25.0-29.9) 06/08/2015  . Anxiety disorder 05/29/2014  . History of  cervical cancer 05/29/2014  . Major depression single episode, in partial remission ([place]) 05/29/2014  . Essential hypertension 05/29/2014    No Known Allergies  Past Surgical History:  Procedure Laterality Date  . CERVICAL CONIZATION W/BX  1993   for cervical cancer    Social History   Tobacco Use  . Smoking status: Current Every Day Smoker    Packs/day: 0.50    Years: 20.00    Pack years: 10.00    Types: Cigarettes    Start date: 02/14/1997  . Smokeless tobacco: Never Used  Substance Use Topics  . Alcohol use: Yes    Alcohol/week: 7.0 standard drinks    Types: 7 Glasses of wine per week    Comment: 1-2 glasses of wine a night  . Drug use: No     Medication list has been reviewed and updated.  Current Meds  Medication Sig  . Cholecalciferol (VITAMIN D-1000 MAX ST) 1000 units tablet Take by mouth.  Marland Kitchen lisinopril-hydrochlorothiazide (PRINZIDE,ZESTORETIC) 10-12.5 MG tablet Take 1 tablet by mouth daily.  . naproxen (NAPROSYN) 500 MG tablet   . pantoprazole (PROTONIX) 40 MG tablet TAKE 1 TABLET BY MOUTH EVERY DAY (NEED APPT FOR FURTHER REFILLS)  . pravastatin (PRAVACHOL) 40 MG tablet Take 1 tablet (40 mg total) by mouth daily.  . sertraline (ZOLOFT) 100 MG tablet TAKE 1 TABLET BY MOUTH EVERY DAY  . traZODone (DESYREL) 50 MG tablet TAKE 1 TABLET (50 MG TOTAL) BY MOUTH AT BEDTIME AS NEEDED FOR SLEEP.  . Vitamin D, Ergocalciferol, (DRISDOL) 1.25 MG (50000 UT) CAPS capsule TAKE 1 CAPSULE (50,000 UNITS TOTAL) BY MOUTH EVERY 7 (SEVEN) DAYS.    PHQ 2/9 Scores  02/23/2018 12/15/2017 02/24/2017 08/19/2016  PHQ - 2 Score 1 0 0 0  PHQ- 9 Score 10 3 0 -    Physical Exam Vitals signs and nursing note reviewed.  Constitutional:      General: She is not in acute distress.    Appearance: She is well-developed.  HENT:     Head: Normocephalic and atraumatic.  Neck:     Musculoskeletal: Normal range of motion and neck supple.  Cardiovascular:     Rate and Rhythm: Normal rate and regular  rhythm.  Pulmonary:     Effort: Pulmonary effort is normal. No respiratory distress.     Breath sounds: Normal breath sounds.  Musculoskeletal: Normal range of motion.  Skin:    General: Skin is warm and dry.     Findings: No rash.  Neurological:     Mental Status: She is alert and oriented to person, place, and time.  Psychiatric:        Behavior: Behavior normal.        Thought Content: Thought content normal.     BP 128/76   Pulse 75   Ht 5' 4.5" (1.638 m)   Wt 156 lb 9.6 oz (71 kg)   SpO2 97%   BMI 26.47 kg/m   Assessment and Plan: 1. Major depressive disorder with single episode, in partial remission (HCC) Improving on sertraline - continue current dose - sertraline (ZOLOFT) 100 MG tablet; Take 1 tablet (100 mg total) by mouth daily.  Dispense: 90 tablet; Refill: 1  2. Essential hypertension controlled - lisinopril-hydrochlorothiazide (PRINZIDE,ZESTORETIC) 10-12.5 MG tablet; Take 1 tablet by mouth daily.  Dispense: 90 tablet; Refill: 3  3. Primary insomnia - traZODone (DESYREL) 50 MG tablet; Take 1 tablet (50 mg total) by mouth at bedtime as needed for sleep.  Dispense: 90 tablet; Refill: 1  4. Gastroesophageal reflux disease with esophagitis - pantoprazole (PROTONIX) 40 MG tablet; Take 1 tablet (40 mg total) by mouth daily.  Dispense: 90 tablet; Refill: 1  5. Mixed hyperlipidemia - pravastatin (PRAVACHOL) 40 MG tablet; Take 1 tablet (40 mg total) by mouth daily.  Dispense: 90 tablet; Refill: 3  6. Encounter for screening mammogram for breast cancer - MM 3D SCREEN BREAST BILATERAL; Future  7. Need for shingles vaccine Will give second dose at [facility] in June - Varicella-zoster vaccine IM   Partially dictated using Editor, commissioning. Any errors are unintentional.  Halina Maidens, MD [facility]  02/23/2018

## 2018-03-28 ENCOUNTER — Encounter: Payer: Self-pay | Admitting: Internal Medicine

## 2018-03-28 ENCOUNTER — Other Ambulatory Visit: Payer: Self-pay

## 2018-03-28 ENCOUNTER — Ambulatory Visit (INDEPENDENT_AMBULATORY_CARE_PROVIDER_SITE_OTHER): Payer: BLUE CROSS/BLUE SHIELD | Admitting: Internal Medicine

## 2018-03-28 VITALS — BP 118/76 | HR 97 | Temp 99.2°F | Ht 64.0 in | Wt 159.0 lb

## 2018-03-28 DIAGNOSIS — J40 Bronchitis, not specified as acute or chronic: Secondary | ICD-10-CM

## 2018-03-28 DIAGNOSIS — R05 Cough: Secondary | ICD-10-CM | POA: Diagnosis not present

## 2018-03-28 DIAGNOSIS — R059 Cough, unspecified: Secondary | ICD-10-CM

## 2018-03-28 LAB — POCT INFLUENZA A/B
Influenza A, POC: NEGATIVE
Influenza B, POC: NEGATIVE

## 2018-03-28 MED ORDER — GUAIFENESIN-CODEINE 100-10 MG/5ML PO SYRP: 5.0000 mL | ORAL_SOLUTION | Freq: Three times a day (TID) | ORAL | 0 refills | Status: DC | PRN

## 2018-03-28 NOTE — Progress Notes (Signed)
Date:  03/28/2018   Name:  Haley Mejia   DOB:  07/19/1965   MRN:  [id number]   Chief Complaint: Cough (Cough, chills, Headache. Started Saturday. Body aches started yesterday. Low grade fever. )  Cough  This is a new problem. The current episode started in the past 7 days. The problem has been unchanged. The problem occurs every few minutes. The cough is non-productive. Associated symptoms include headaches, myalgias, postnasal drip and a sore throat. Pertinent negatives include no chills, fever, shortness of breath or wheezing. She has tried OTC cough suppressant for the symptoms. The treatment provided mild relief. There is no history of asthma or COPD.    Review of Systems  Constitutional: Positive for fatigue. Negative for chills, diaphoresis and fever.  HENT: Positive for postnasal drip and sore throat. Negative for trouble swallowing.   Respiratory: Positive for cough. Negative for chest tightness, shortness of breath and wheezing.   Gastrointestinal: Negative for diarrhea, nausea and vomiting.  Musculoskeletal: Positive for myalgias.  Neurological: Positive for headaches. Negative for dizziness and light-headedness.    Patient Active Problem List   Diagnosis Date Noted  . ASCUS with positive high risk HPV cervical 03/03/2017  . Adenomatous colon polyp 10/23/2015  . Tobacco use disorder 10/23/2015  . Gastritis determined by endoscopy 10/23/2015  . Functional diarrhea 10/23/2015  . Insomnia 10/23/2015  . Vitamin D deficiency 06/09/2015  . Mixed hyperlipidemia 06/09/2015  . Esophageal stricture 06/08/2015  . Overweight (BMI 25.0-29.9) 06/08/2015  . Anxiety disorder 05/29/2014  . History of cervical cancer 05/29/2014  . Major depression single episode, in partial remission ([place]) 05/29/2014  . Essential hypertension 05/29/2014    No Known Allergies  Past Surgical History:  Procedure Laterality Date  . CERVICAL CONIZATION W/BX  1993   for cervical cancer    Social  History   Tobacco Use  . Smoking status: Current Every Day Smoker    Packs/day: 0.50    Years: 20.00    Pack years: 10.00    Types: Cigarettes    Start date: 02/14/1997  . Smokeless tobacco: Never Used  Substance Use Topics  . Alcohol use: Yes    Alcohol/week: 7.0 standard drinks    Types: 7 Glasses of wine per week    Comment: 1-2 glasses of wine a night  . Drug use: No     Medication list has been reviewed and updated.  Current Meds  Medication Sig  . Cholecalciferol (VITAMIN D-1000 MAX ST) 1000 units tablet Take by mouth.  Marland Kitchen lisinopril-hydrochlorothiazide (PRINZIDE,ZESTORETIC) 10-12.5 MG tablet Take 1 tablet by mouth daily.  . naproxen (NAPROSYN) 500 MG tablet   . pantoprazole (PROTONIX) 40 MG tablet Take 1 tablet (40 mg total) by mouth daily.  . pravastatin (PRAVACHOL) 40 MG tablet Take 1 tablet (40 mg total) by mouth daily.  . sertraline (ZOLOFT) 100 MG tablet Take 1 tablet (100 mg total) by mouth daily.  . traZODone (DESYREL) 50 MG tablet Take 1 tablet (50 mg total) by mouth at bedtime as needed for sleep.  . Vitamin D, Ergocalciferol, (DRISDOL) 1.25 MG (50000 UT) CAPS capsule TAKE 1 CAPSULE (50,000 UNITS TOTAL) BY MOUTH EVERY 7 (SEVEN) DAYS.    PHQ 2/9 Scores 03/28/2018 02/23/2018 12/15/2017 02/24/2017  PHQ - 2 Score 0 1 0 0  PHQ- 9 Score 6 10 3  0    Physical Exam Vitals signs and nursing note reviewed.  Constitutional:      General: She is not in acute distress.  Appearance: Normal appearance. She is well-developed.  HENT:     Head: Normocephalic and atraumatic.     Right Ear: Ear canal normal. Tympanic membrane is scarred. Tympanic membrane is not erythematous.     Left Ear: Ear canal normal. Tympanic membrane is scarred and perforated. Tympanic membrane is not erythematous.     Nose: Nose normal.     Right Sinus: No maxillary sinus tenderness or frontal sinus tenderness.     Left Sinus: No maxillary sinus tenderness or frontal sinus tenderness.  Eyes:      Pupils: Pupils are equal, round, and reactive to light.  Neck:     Musculoskeletal: Normal range of motion and neck supple.  Cardiovascular:     Rate and Rhythm: Normal rate and regular rhythm.     Pulses: Normal pulses.  Pulmonary:     Effort: Pulmonary effort is normal. No respiratory distress.     Breath sounds: Normal breath sounds. No wheezing, rhonchi or rales.  Musculoskeletal: Normal range of motion.  Lymphadenopathy:     Cervical: No cervical adenopathy.  Skin:    General: Skin is warm and dry.     Findings: No rash.  Neurological:     Mental Status: She is alert and oriented to person, place, and time.  Psychiatric:        Behavior: Behavior normal.        Thought Content: Thought content normal.     BP 118/76   Pulse 97   Temp 99.2 F (37.3 C) (Oral)   Ht 5\' 4"  (1.626 m)   Wt 159 lb (72.1 kg)   SpO2 96%   BMI 27.29 kg/m   Assessment and Plan: 1. Bronchitis Likely viral Rapid flu test negative Push fluids, take tylenol, rest Call if sx change for antibiotics  2. Cough - POCT Influenza A/B - guaiFENesin-codeine (ROBITUSSIN AC) 100-10 MG/5ML syrup; Take 5 mLs by mouth 3 (three) times daily as needed for cough.  Dispense: 118 mL; Refill: 0   Partially dictated using Editor, commissioning. Any errors are unintentional.  Halina Maidens, MD [facility]  03/28/2018

## 2018-03-28 NOTE — Patient Instructions (Signed)
Take tylenol 3 times a day for the fever and aches  Use Robitussin AC at night - during the day only if staying home.

## 2018-06-18 DIAGNOSIS — L570 Actinic keratosis: Secondary | ICD-10-CM | POA: Diagnosis not present

## 2018-06-18 DIAGNOSIS — L578 Other skin changes due to chronic exposure to nonionizing radiation: Secondary | ICD-10-CM | POA: Diagnosis not present

## 2018-07-13 ENCOUNTER — Ambulatory Visit (INDEPENDENT_AMBULATORY_CARE_PROVIDER_SITE_OTHER): Payer: BC Managed Care – PPO | Admitting: Internal Medicine

## 2018-07-13 ENCOUNTER — Encounter: Payer: Self-pay | Admitting: Internal Medicine

## 2018-07-13 ENCOUNTER — Other Ambulatory Visit: Payer: Self-pay

## 2018-07-13 VITALS — BP 128/70 | HR 87 | Ht 64.0 in | Wt 155.0 lb

## 2018-07-13 DIAGNOSIS — Z1231 Encounter for screening mammogram for malignant neoplasm of breast: Secondary | ICD-10-CM

## 2018-07-13 DIAGNOSIS — Z0000 Encounter for general adult medical examination without abnormal findings: Secondary | ICD-10-CM

## 2018-07-13 DIAGNOSIS — E782 Mixed hyperlipidemia: Secondary | ICD-10-CM

## 2018-07-13 DIAGNOSIS — I10 Essential (primary) hypertension: Secondary | ICD-10-CM | POA: Diagnosis not present

## 2018-07-13 DIAGNOSIS — F5101 Primary insomnia: Secondary | ICD-10-CM

## 2018-07-13 DIAGNOSIS — E559 Vitamin D deficiency, unspecified: Secondary | ICD-10-CM

## 2018-07-13 DIAGNOSIS — K2100 Gastro-esophageal reflux disease with esophagitis, without bleeding: Secondary | ICD-10-CM

## 2018-07-13 DIAGNOSIS — F324 Major depressive disorder, single episode, in partial remission: Secondary | ICD-10-CM | POA: Diagnosis not present

## 2018-07-13 DIAGNOSIS — Z23 Encounter for immunization: Secondary | ICD-10-CM | POA: Diagnosis not present

## 2018-07-13 DIAGNOSIS — R300 Dysuria: Secondary | ICD-10-CM

## 2018-07-13 LAB — POCT URINALYSIS DIPSTICK
Bilirubin, UA: NEGATIVE
Glucose, UA: NEGATIVE
Ketones, UA: NEGATIVE
Leukocytes, UA: NEGATIVE
Nitrite, UA: POSITIVE
Protein, UA: NEGATIVE
Spec Grav, UA: 1.025 (ref 1.010–1.025)
Urobilinogen, UA: 0.2 E.U./dL
pH, UA: 5 (ref 5.0–8.0)

## 2018-07-13 MED ORDER — PANTOPRAZOLE SODIUM 40 MG PO TBEC: 40.0000 mg | DELAYED_RELEASE_TABLET | Freq: Every day | ORAL | 1 refills | Status: DC

## 2018-07-13 MED ORDER — SERTRALINE HCL 100 MG PO TABS: 100.0000 mg | ORAL_TABLET | Freq: Every day | ORAL | 1 refills | Status: DC

## 2018-07-13 MED ORDER — CIPROFLOXACIN HCL 250 MG PO TABS: 250.0000 mg | ORAL_TABLET | Freq: Two times a day (BID) | ORAL | 0 refills | Status: AC

## 2018-07-13 MED ORDER — VITAMIN D (ERGOCALCIFEROL) 1.25 MG (50000 UNIT) PO CAPS: 50000.0000 [IU] | ORAL_CAPSULE | ORAL | 3 refills | Status: DC

## 2018-07-13 MED ORDER — TRAZODONE HCL 50 MG PO TABS: 50.0000 mg | ORAL_TABLET | Freq: Every evening | ORAL | 1 refills | Status: DC | PRN

## 2018-07-13 NOTE — Progress Notes (Signed)
Date:  07/13/2018   Name:  Haley Mejia   DOB:  07-01-1965   MRN:  [id number]   Chief Complaint: Annual Exam (Breast Exam.) Haley Mejia is a 53 y.o. female who presents today for her Complete Annual Exam. She feels fairly well. She reports exercising regularly. She reports she is sleeping well. She denies breast issues. She continues to smoke and is not sure she is ready to try to quit. She is ready for her shingrix #2. Pap 02/2017 - ASCUS with + HPV - being followed by [facility]. Last bx 04/2017 with [facility]. Colonoscopy 2016 - due 2021 Mammogram due  Hypertension  This is a chronic problem. The problem is controlled. Pertinent negatives include no chest pain, headaches, palpitations or shortness of breath. Past treatments include ACE inhibitors and diuretics. The current treatment provides significant improvement. There are no compliance problems.   Hyperlipidemia  The problem is controlled. She has no history of diabetes, hypothyroidism or liver disease. Pertinent negatives include no chest pain or shortness of breath. Current antihyperlipidemic treatment includes statins. The current treatment provides significant improvement of lipids.  Depression         This is a chronic (followed by Psych) problem.  Associated symptoms include no fatigue and no headaches.  Past treatments include SSRIs - Selective serotonin reuptake inhibitors (and trazodone for sleep).  Compliance with treatment is good.  Previous treatment provided significant relief.   Pertinent negatives include no hypothyroidism.   Review of Systems  Constitutional: Negative for chills, fatigue and fever.  HENT: Negative for congestion, hearing loss, tinnitus, trouble swallowing and voice change.   Eyes: Negative for visual disturbance.  Respiratory: Negative for cough, chest tightness, shortness of breath and wheezing.   Cardiovascular: Negative for chest pain, palpitations and leg swelling.  Gastrointestinal: Negative for  abdominal pain, constipation, diarrhea and vomiting.  Endocrine: Negative for polydipsia and polyuria.  Genitourinary: Negative for dysuria, frequency, genital sores, vaginal bleeding and vaginal discharge.  Musculoskeletal: Negative for arthralgias, gait problem and joint swelling.  Skin: Negative for color change and rash.  Neurological: Negative for dizziness, tremors, light-headedness and headaches.  Hematological: Negative for adenopathy. Does not bruise/bleed easily.  Psychiatric/Behavioral: Positive for depression. Negative for dysphoric mood and sleep disturbance. The patient is not nervous/anxious.     Patient Active Problem List   Diagnosis Date Noted  . ASCUS with positive high risk HPV cervical 03/03/2017  . Adenomatous colon polyp 10/23/2015  . Tobacco use disorder 10/23/2015  . Gastritis determined by endoscopy 10/23/2015  . Functional diarrhea 10/23/2015  . Insomnia 10/23/2015  . Vitamin D deficiency 06/09/2015  . Mixed hyperlipidemia 06/09/2015  . Esophageal stricture 06/08/2015  . Overweight (BMI 25.0-29.9) 06/08/2015  . Anxiety disorder 05/29/2014  . History of cervical cancer 05/29/2014  . Major depression single episode, in partial remission ([place]) 05/29/2014  . Essential hypertension 05/29/2014    No Known Allergies  Past Surgical History:  Procedure Laterality Date  . CERVICAL CONIZATION W/BX  1993   for cervical cancer    Social History   Tobacco Use  . Smoking status: Current Every Day Smoker    Packs/day: 0.50    Years: 20.00    Pack years: 10.00    Types: Cigarettes    Start date: 02/14/1997  . Smokeless tobacco: Never Used  Substance Use Topics  . Alcohol use: Yes    Alcohol/week: 7.0 standard drinks    Types: 7 Glasses of wine per week    Comment: 1-2  glasses of wine a night  . Drug use: No     Medication list has been reviewed and updated.  Current Meds  Medication Sig  . Cholecalciferol (VITAMIN D-1000 MAX ST) 1000 units tablet  Take by mouth.  Marland Kitchen lisinopril-hydrochlorothiazide (PRINZIDE,ZESTORETIC) 10-12.5 MG tablet Take 1 tablet by mouth daily.  . naproxen (NAPROSYN) 500 MG tablet   . pantoprazole (PROTONIX) 40 MG tablet Take 1 tablet (40 mg total) by mouth daily.  . pravastatin (PRAVACHOL) 40 MG tablet Take 1 tablet (40 mg total) by mouth daily.  . sertraline (ZOLOFT) 100 MG tablet Take 1 tablet (100 mg total) by mouth daily.  . traZODone (DESYREL) 50 MG tablet Take 1 tablet (50 mg total) by mouth at bedtime as needed for sleep.    PHQ 2/9 Scores 07/13/2018 07/13/2018 03/28/2018 02/23/2018  PHQ - 2 Score 0 0 0 1  PHQ- 9 Score 0 - 6 10    BP Readings from Last 3 Encounters:  07/13/18 128/70  03/28/18 118/76  02/23/18 128/76    Physical Exam Vitals signs and nursing note reviewed.  Constitutional:      General: She is not in acute distress.    Appearance: She is well-developed.  HENT:     Head: Normocephalic and atraumatic.     Right Ear: Tympanic membrane and ear canal normal.     Left Ear: Tympanic membrane and ear canal normal.     Nose:     Right Sinus: No maxillary sinus tenderness.     Left Sinus: No maxillary sinus tenderness.     Mouth/Throat:     Pharynx: Uvula midline.  Eyes:     General: No scleral icterus.       Right eye: No discharge.        Left eye: No discharge.     Conjunctiva/sclera: Conjunctivae normal.  Neck:     Musculoskeletal: Normal range of motion. No erythema.     Thyroid: No thyromegaly.     Vascular: No carotid bruit.  Cardiovascular:     Rate and Rhythm: Normal rate and regular rhythm.     Pulses: Normal pulses.     Heart sounds: Normal heart sounds.  Pulmonary:     Effort: Pulmonary effort is normal. No respiratory distress.     Breath sounds: No wheezing.  Chest:     Breasts:        Right: No mass, nipple discharge, skin change or tenderness.        Left: No mass, nipple discharge, skin change or tenderness.  Abdominal:     General: Bowel sounds are normal.      Palpations: Abdomen is soft.     Tenderness: There is no abdominal tenderness.  Musculoskeletal: Normal range of motion.  Lymphadenopathy:     Cervical: No cervical adenopathy.  Skin:    General: Skin is warm and dry.     Findings: No rash.  Neurological:     Mental Status: She is alert and oriented to person, place, and time.     Cranial Nerves: No cranial nerve deficit.     Sensory: No sensory deficit.     Deep Tendon Reflexes: Reflexes are normal and symmetric.  Psychiatric:        Speech: Speech normal.        Behavior: Behavior normal.        Thought Content: Thought content normal.     Wt Readings from Last 3 Encounters:  07/13/18 145 lb (65.8 kg)  03/28/18  159 lb (72.1 kg)  02/23/18 156 lb 9.6 oz (71 kg)    BP 128/70   Pulse 87   Ht 5\' 4"  (1.626 m)   Wt 145 lb (65.8 kg)   SpO2 96%   BMI 24.89 kg/m   Assessment and Plan: 1. Annual physical exam Normal exam - POCT urinalysis dipstick  2. Encounter for screening mammogram for breast cancer Schedule Mammogram  3. Essential hypertension controlled - CBC with Differential/Platelet - Comprehensive metabolic panel  4. Mixed hyperlipidemia On statin therapy - Lipid panel  5. Major depression single episode, in partial remission ([place]) Doing well on current therapy - TSH - sertraline (ZOLOFT) 100 MG tablet; Take 1 tablet (100 mg total) by mouth daily.  Dispense: 90 tablet; Refill: 1  6. Gastroesophageal reflux disease with esophagitis controlled with PPI - pantoprazole (PROTONIX) 40 MG tablet; Take 1 tablet (40 mg total) by mouth daily.  Dispense: 90 tablet; Refill: 1  7. Primary insomnia - traZODone (DESYREL) 50 MG tablet; Take 1 tablet (50 mg total) by mouth at bedtime as needed for sleep.  Dispense: 90 tablet; Refill: 1  8. Vitamin D deficiency Continue supplement weekly - Vitamin D, Ergocalciferol, (DRISDOL) 1.25 MG (50000 UT) CAPS capsule; Take 1 capsule (50,000 Units total) by mouth every 7 (seven)  days.  Dispense: 12 capsule; Refill: 3  9. Need for shingles vaccine Second dose today - Varicella-zoster vaccine IM   Partially dictated using Editor, commissioning. Any errors are unintentional.  Halina Maidens, MD [facility]  07/13/2018

## 2018-07-14 LAB — CBC WITH DIFFERENTIAL/PLATELET
Basophils Absolute: 0 10*3/uL (ref 0.0–0.2)
Basos: 1 %
EOS (ABSOLUTE): 0.1 10*3/uL (ref 0.0–0.4)
Eos: 1 %
Hematocrit: 41.2 % (ref 34.0–46.6)
Hemoglobin: 13.8 g/dL (ref 11.1–15.9)
Immature Grans (Abs): 0 10*3/uL (ref 0.0–0.1)
Immature Granulocytes: 0 %
Lymphocytes Absolute: 1.9 10*3/uL (ref 0.7–3.1)
Lymphs: 33 %
MCH: 32.9 pg (ref 26.6–33.0)
MCHC: 33.5 g/dL (ref 31.5–35.7)
MCV: 98 fL — ABNORMAL HIGH (ref 79–97)
Monocytes Absolute: 0.6 10*3/uL (ref 0.1–0.9)
Monocytes: 10 %
Neutrophils Absolute: 3.1 10*3/uL (ref 1.4–7.0)
Neutrophils: 55 %
Platelets: 339 10*3/uL (ref 150–450)
RBC: 4.19 x10E6/uL (ref 3.77–5.28)
RDW: 12.6 % (ref 11.7–15.4)
WBC: 5.6 10*3/uL (ref 3.4–10.8)

## 2018-07-14 LAB — COMPREHENSIVE METABOLIC PANEL
ALT: 23 IU/L (ref 0–32)
AST: 21 IU/L (ref 0–40)
Albumin/Globulin Ratio: 2.1 (ref 1.2–2.2)
Albumin: 4.8 g/dL (ref 3.8–4.9)
Alkaline Phosphatase: 59 IU/L (ref 39–117)
BUN/Creatinine Ratio: 13 (ref 9–23)
BUN: 10 mg/dL (ref 6–24)
Bilirubin Total: 0.3 mg/dL (ref 0.0–1.2)
CO2: 21 mmol/L (ref 20–29)
Calcium: 9.9 mg/dL (ref 8.7–10.2)
Chloride: 106 mmol/L (ref 96–106)
Creatinine, Ser: 0.76 mg/dL (ref 0.57–1.00)
GFR calc Af Amer: 103 mL/min/{1.73_m2} (ref >59)
GFR calc non Af Amer: 89 mL/min/{1.73_m2} (ref >59)
Globulin, Total: 2.3 g/dL (ref 1.5–4.5)
Glucose: 105 mg/dL — ABNORMAL HIGH (ref 65–99)
Potassium: 4.5 mmol/L (ref 3.5–5.2)
Sodium: 142 mmol/L (ref 134–144)
Total Protein: 7.1 g/dL (ref 6.0–8.5)

## 2018-07-14 LAB — LIPID PANEL
Chol/HDL Ratio: 4 ratio (ref 0.0–4.4)
Cholesterol, Total: 182 mg/dL (ref 100–199)
HDL: 45 mg/dL (ref >39)
LDL Calculated: 110 mg/dL — ABNORMAL HIGH (ref 0–99)
Triglycerides: 134 mg/dL (ref 0–149)
VLDL Cholesterol Cal: 27 mg/dL (ref 5–40)

## 2018-07-14 LAB — TSH: TSH: 1.47 u[IU]/mL (ref 0.450–4.500)

## 2018-07-20 ENCOUNTER — Inpatient Hospital Stay: Admission: RE | Admit: 2018-07-20 | Payer: BLUE CROSS/BLUE SHIELD | Source: Ambulatory Visit

## 2018-08-01 DIAGNOSIS — Z20828 Contact with and (suspected) exposure to other viral communicable diseases: Secondary | ICD-10-CM | POA: Diagnosis not present

## 2018-10-16 DIAGNOSIS — Z20828 Contact with and (suspected) exposure to other viral communicable diseases: Secondary | ICD-10-CM | POA: Diagnosis not present

## 2018-12-22 DIAGNOSIS — M791 Myalgia, unspecified site: Secondary | ICD-10-CM | POA: Diagnosis not present

## 2018-12-22 DIAGNOSIS — Z20828 Contact with and (suspected) exposure to other viral communicable diseases: Secondary | ICD-10-CM | POA: Diagnosis not present

## 2018-12-22 DIAGNOSIS — N611 Abscess of the breast and nipple: Secondary | ICD-10-CM | POA: Diagnosis not present

## 2018-12-22 DIAGNOSIS — R110 Nausea: Secondary | ICD-10-CM | POA: Diagnosis not present

## 2018-12-22 DIAGNOSIS — R509 Fever, unspecified: Secondary | ICD-10-CM | POA: Diagnosis not present

## 2018-12-23 DIAGNOSIS — E876 Hypokalemia: Secondary | ICD-10-CM | POA: Diagnosis not present

## 2018-12-23 DIAGNOSIS — R1900 Intra-abdominal and pelvic swelling, mass and lump, unspecified site: Secondary | ICD-10-CM | POA: Diagnosis not present

## 2018-12-23 DIAGNOSIS — F341 Dysthymic disorder: Secondary | ICD-10-CM | POA: Diagnosis not present

## 2018-12-23 DIAGNOSIS — L03313 Cellulitis of chest wall: Secondary | ICD-10-CM | POA: Diagnosis not present

## 2018-12-23 DIAGNOSIS — K219 Gastro-esophageal reflux disease without esophagitis: Secondary | ICD-10-CM | POA: Diagnosis not present

## 2018-12-23 DIAGNOSIS — N61 Mastitis without abscess: Secondary | ICD-10-CM | POA: Diagnosis not present

## 2018-12-23 DIAGNOSIS — A419 Sepsis, unspecified organism: Secondary | ICD-10-CM | POA: Diagnosis not present

## 2018-12-23 DIAGNOSIS — Z6829 Body mass index (BMI) 29.0-29.9, adult: Secondary | ICD-10-CM | POA: Diagnosis not present

## 2018-12-23 DIAGNOSIS — E785 Hyperlipidemia, unspecified: Secondary | ICD-10-CM | POA: Diagnosis not present

## 2018-12-23 DIAGNOSIS — F17210 Nicotine dependence, cigarettes, uncomplicated: Secondary | ICD-10-CM | POA: Diagnosis not present

## 2018-12-23 DIAGNOSIS — K5732 Diverticulitis of large intestine without perforation or abscess without bleeding: Secondary | ICD-10-CM | POA: Diagnosis not present

## 2018-12-23 DIAGNOSIS — E669 Obesity, unspecified: Secondary | ICD-10-CM | POA: Diagnosis not present

## 2018-12-23 DIAGNOSIS — J90 Pleural effusion, not elsewhere classified: Secondary | ICD-10-CM | POA: Diagnosis not present

## 2018-12-23 DIAGNOSIS — I10 Essential (primary) hypertension: Secondary | ICD-10-CM | POA: Diagnosis not present

## 2018-12-23 DIAGNOSIS — L03112 Cellulitis of left axilla: Secondary | ICD-10-CM | POA: Diagnosis not present

## 2018-12-23 DIAGNOSIS — N611 Abscess of the breast and nipple: Secondary | ICD-10-CM | POA: Diagnosis not present

## 2018-12-23 DIAGNOSIS — T501X5A Adverse effect of loop [high-ceiling] diuretics, initial encounter: Secondary | ICD-10-CM | POA: Diagnosis not present

## 2018-12-23 DIAGNOSIS — Z20828 Contact with and (suspected) exposure to other viral communicable diseases: Secondary | ICD-10-CM | POA: Diagnosis not present

## 2018-12-31 ENCOUNTER — Telehealth: Payer: Self-pay | Admitting: Internal Medicine

## 2018-12-31 ENCOUNTER — Ambulatory Visit (INDEPENDENT_AMBULATORY_CARE_PROVIDER_SITE_OTHER): Payer: BC Managed Care – PPO | Admitting: Internal Medicine

## 2018-12-31 ENCOUNTER — Other Ambulatory Visit: Payer: Self-pay

## 2018-12-31 ENCOUNTER — Telehealth: Payer: Self-pay

## 2018-12-31 ENCOUNTER — Encounter: Payer: Self-pay | Admitting: Internal Medicine

## 2018-12-31 VITALS — BP 124/68 | HR 100 | Temp 98.5°F | Ht 64.0 in | Wt 153.0 lb

## 2018-12-31 DIAGNOSIS — N611 Abscess of the breast and nipple: Secondary | ICD-10-CM | POA: Diagnosis not present

## 2018-12-31 DIAGNOSIS — K5733 Diverticulitis of large intestine without perforation or abscess with bleeding: Secondary | ICD-10-CM | POA: Diagnosis not present

## 2018-12-31 MED ORDER — CURITY IODOFORM PACKING STRIP MISC: 1.0000 "application " | Freq: Every day | 1 refills | Status: AC

## 2018-12-31 MED ORDER — ONDANSETRON 8 MG PO TBDP: 8.0000 mg | ORAL_TABLET | Freq: Three times a day (TID) | ORAL | 0 refills | Status: AC | PRN

## 2018-12-31 NOTE — Telephone Encounter (Signed)
I am getting ready to call the pt to schedule her to be seen today. Thank you.

## 2018-12-31 NOTE — Telephone Encounter (Signed)
Patient scheduled today and 4 pm. Told to bring her papers from ER and all her medications.

## 2018-12-31 NOTE — Telephone Encounter (Signed)
Sister is asking if a antinausea medicine to be called in for [facility].

## 2018-12-31 NOTE — Telephone Encounter (Signed)
She needs to be seen today.  Have her bring any papers from her ER visit Saturday.

## 2018-12-31 NOTE — Telephone Encounter (Signed)
Pt was admitted into the hospital for 8 days with MRSA and diverticulitis, states she is back home and was told to follow up with her PCP. Was given medications and currently taking the medications but doesn't seem to get any better. As advised per CMA I told pt to typically she should continue medications and a follow up appointment was not needed, she got very upset and said she could not believe we did not want to see her. I then again relayed message from [place] about continuing medications and following up with urgent care, she was still very upset and said she would look for someone else that could help her.

## 2018-12-31 NOTE — Telephone Encounter (Signed)
Patient calling from [place] while admitted with MRSA. She stated she was not advised when to make appt by hospital but wants PCP to be on board and since she is in [place] wants [place] to be the ones ordering [facility]. Patient was not sure when she would finish Abx or when she would be D/C and I explained they usually set up [facility] based on where she lives and that is done during her D/C . I told her it is best to wait until she is off antibiotics as she wanted to be seen a day after she does get D/C and I verified that with PCP that she would be seen to soon and would not be anything to go over. I explained we need to see how meds are doing and it will be best if she is on them a few days but we can do what D/C suggests. She was not sure of plan [facility] had so I explained she should let them work out this and tell her or [place] when it is best to see you and they can get [facility] coming out before seen by PCP.   All was verified through Halina Maidens and when she hung up I was unable to get name and DOB. Name given later on to [facility] allowing me to document.

## 2018-12-31 NOTE — Telephone Encounter (Signed)
I did inform front desk of this information. We are unable to reculture the pt and the UC she went to documented that she return their for a recheck culture. This is the only reason we could not see the pt to follow up for this. Spoke with Army Melia to be sure my actions were correct and she did state we do not do cultures and pt would need to follow up with UC as I stated.  Benedict Needy, CMA

## 2018-12-31 NOTE — Telephone Encounter (Signed)
Please advise 

## 2018-12-31 NOTE — Patient Instructions (Addendum)
Re-pack the wound lightly daily.  Cover with a large bandaid.  Take Zofran about 30 minutes before eating a small amount.  The take the antibiotics.  Eat whatever you think you can keep down and tastes good.  No alcohol.  Call me on Wednesday Morning with a status report.

## 2018-12-31 NOTE — Telephone Encounter (Signed)
Sister called in to say that they need an order for a home health nurse to change her dressings 2 twice a day.

## 2018-12-31 NOTE — Telephone Encounter (Signed)
Patient dad Haley Mejia called saying pt called him crying and upset because we would not see her. Explained to pts dad its not that we would not see her but that we can not re-culture her wound that tested positive for MRSA.   He then stated pt is vomiting and having stomach pain. They sent her home with abx yesterday for diverticulitis and she wants to be seen for that. Explained that she needs to complete the abx to feel better. If she continues to vomit and have pains, or if she is unable to keep anything down then she needs to see UC or ER for treatment. He wanted to know how she could see a GI doctor for this. Explained if the treatment does not work, we can see her in our clinic and refer her to GI if needed.  He verbalized understanding and stated his other daughter says pt is "dramatic" so he does not know how sick she really is but wanted to call and clarify because she said we could not see her.  Apologized to him and just explained that we cannot re-culture her wound but if she continues to vomit then to seek care at ER because sh emay need fluids.   He verbalized understanding and thanked me.   Benedict Needy, CMA

## 2018-12-31 NOTE — Progress Notes (Signed)
Date:  12/31/2018   Name:  Haley Mejia   DOB:  Dec 27, 1965   MRN:  [id number]   Chief Complaint: Abdominal Pain (Diverticulitis. Was seen [facility]. Was admitted for 8 days. ) and Breast Wounds  Abdominal Pain This is a new problem. The current episode started in the past 7 days (started 5 days ago when in hospital in [facility] for breast cellulitis). The problem occurs constantly. The problem has been unchanged. The pain is located in the LLQ. The pain is moderate. The quality of the pain is cramping and colicky. The abdominal pain does not radiate. Associated symptoms include diarrhea (small loose stools), nausea and vomiting (with oral antibiotics). Pertinent negatives include no constipation, fever or headaches. The pain is aggravated by movement. Treatments tried: on Metronidazole and Cipro x 10 more days. Prior diagnostic workup includes CT scan (done at hospital).  Breast wound - Cultured at [facility] in [place] where an I&D was attempted.   MRSA Resistant to augmentin and Cipro was found.  2 days later the area was much worse and she was admitted to [facility] medical center.  Lab Results  Component Value Date   CREATININE 0.76 07/13/2018   BUN 10 07/13/2018   NA 142 07/13/2018   K 4.5 07/13/2018   CL 106 07/13/2018   CO2 21 07/13/2018   Lab Results  Component Value Date   CHOL 182 07/13/2018   HDL 45 07/13/2018   LDLCALC 110 (H) 07/13/2018   TRIG 134 07/13/2018   CHOLHDL 4.0 07/13/2018   Lab Results  Component Value Date   TSH 1.470 07/13/2018   No results found for: HGBA1C   Review of Systems  Constitutional: Positive for fatigue. Negative for chills, diaphoresis and fever.  Respiratory: Positive for cough and shortness of breath (quit smoking while hospitalized). Negative for chest tightness and wheezing.   Cardiovascular: Negative for chest pain, palpitations and leg swelling.  Gastrointestinal: Positive for abdominal pain, diarrhea  (small loose stools), nausea and vomiting (with oral antibiotics). Negative for constipation.  Genitourinary: Negative for difficulty urinating.  Neurological: Negative for dizziness, light-headedness and headaches.    Patient Active Problem List   Diagnosis Date Noted  . ASCUS with positive high risk HPV cervical 03/03/2017  . Adenomatous colon polyp 10/23/2015  . Tobacco use disorder 10/23/2015  . Gastritis determined by endoscopy 10/23/2015  . Functional diarrhea 10/23/2015  . Insomnia 10/23/2015  . Vitamin D deficiency 06/09/2015  . Mixed hyperlipidemia 06/09/2015  . Esophageal stricture 06/08/2015  . Overweight (BMI 25.0-29.9) 06/08/2015  . Anxiety disorder 05/29/2014  . History of cervical cancer 05/29/2014  . Major depression single episode, in partial remission ([place]) 05/29/2014  . Essential hypertension 05/29/2014    No Known Allergies  Past Surgical History:  Procedure Laterality Date  . CERVICAL CONIZATION W/BX  1993   for cervical cancer    Social History   Tobacco Use  . Smoking status: Former Smoker    Packs/day: 0.50    Years: 20.00    Pack years: 10.00    Types: Cigarettes    Start date: 02/14/1997    Quit date: 12/24/2018    Years since quitting: 0.0  . Smokeless tobacco: Never Used  Substance Use Topics  . Alcohol use: Yes    Alcohol/week: 7.0 standard drinks    Types: 7 Glasses of wine per week    Comment: 1-2 glasses of wine a night  . Drug use: No  Medication list has been reviewed and updated.  Current Meds  Medication Sig  . ciprofloxacin (CIPRO) 500 MG tablet Take 500 mg by mouth 2 (two) times daily.  Marland Kitchen doxycycline (DORYX) 100 MG EC tablet Take 100 mg by mouth 2 (two) times daily.  Marland Kitchen lisinopril-hydrochlorothiazide (PRINZIDE,ZESTORETIC) 10-12.5 MG tablet Take 1 tablet by mouth daily.  . metroNIDAZOLE (FLAGYL) 500 MG tablet Take 500 mg by mouth 3 (three) times daily.  . naproxen (NAPROSYN) 500 MG tablet   . pantoprazole (PROTONIX) 40  MG tablet Take 1 tablet (40 mg total) by mouth daily.  . pravastatin (PRAVACHOL) 40 MG tablet Take 1 tablet (40 mg total) by mouth daily.  . sertraline (ZOLOFT) 100 MG tablet Take 1 tablet (100 mg total) by mouth daily.  . traZODone (DESYREL) 50 MG tablet Take 1 tablet (50 mg total) by mouth at bedtime as needed for sleep.    PHQ 2/9 Scores 12/31/2018 07/13/2018 07/13/2018 03/28/2018  PHQ - 2 Score 2 0 0 0  PHQ- 9 Score 8 0 - 6    BP Readings from Last 3 Encounters:  12/31/18 124/68  07/13/18 128/70  03/28/18 118/76    Physical Exam Constitutional:      Appearance: She is ill-appearing.  Cardiovascular:     Rate and Rhythm: Normal rate and regular rhythm.     Heart sounds: Normal heart sounds.  Pulmonary:     Effort: Pulmonary effort is normal.     Breath sounds: Normal breath sounds. No decreased breath sounds or wheezing.  Abdominal:     General: Abdomen is flat. Bowel sounds are decreased.     Palpations: Abdomen is soft.     Tenderness: There is abdominal tenderness in the left lower quadrant. There is no guarding.  Skin:      Neurological:     Mental Status: She is alert.  Psychiatric:        Attention and Perception: Attention normal.        Mood and Affect: Mood is depressed.     Wt Readings from Last 3 Encounters:  12/31/18 153 lb (69.4 kg)  07/13/18 155 lb (70.3 kg)  03/28/18 159 lb (72.1 kg)    BP 124/68   Pulse 100   Temp 98.5 F (36.9 C) (Oral)   Ht 5\' 4"  (1.626 m)   Wt 153 lb (69.4 kg)   SpO2 95%   BMI 26.26 kg/m   Assessment and Plan: 1. Diverticulitis of large intestine without perforation or abscess with bleeding Will obtain records of CT scan from [place] and Flagyl - take zofran beforehand to see if she can keep it down If she can not eat and drink and take antibiotics, she will need to go to the ER Call in 2 days to report progress; otherwise follow up in one week - ondansetron (ZOFRAN ODT) 8 MG disintegrating tablet; Take 1  tablet (8 mg total) by mouth every 8 (eight) hours as needed for nausea or vomiting.  Dispense: 40 tablet; Refill: 0  2. Breast abscess of female Site looks good with iodoform gauze packing in place Surrounding skin normal Continue to pack the wound daily and cover with bandaid. Finish course of Doxycycline - Gauze Pads & Dressings (CURITY IODOFORM PACKING STRIP) MISC; 1 application by Does not apply route daily.  Dispense: 2 each; Refill: 1   Partially dictated using Editor, commissioning. Any errors are unintentional.  Halina Maidens, MD [facility]  12/31/2018

## 2019-01-02 ENCOUNTER — Telehealth: Payer: Self-pay | Admitting: Internal Medicine

## 2019-01-02 NOTE — Telephone Encounter (Signed)
Called pt to remind of upcoming appt on Monday, and she wanted to let Dr. B know she is doing better and will be here for her appt.

## 2019-01-07 ENCOUNTER — Ambulatory Visit: Payer: BC Managed Care – PPO | Admitting: Internal Medicine

## 2019-01-07 DIAGNOSIS — R112 Nausea with vomiting, unspecified: Secondary | ICD-10-CM | POA: Diagnosis not present

## 2019-01-07 DIAGNOSIS — J22 Unspecified acute lower respiratory infection: Secondary | ICD-10-CM | POA: Diagnosis not present

## 2019-01-07 DIAGNOSIS — F329 Major depressive disorder, single episode, unspecified: Secondary | ICD-10-CM | POA: Diagnosis not present

## 2019-01-07 DIAGNOSIS — D72829 Elevated white blood cell count, unspecified: Secondary | ICD-10-CM | POA: Diagnosis not present

## 2019-01-07 DIAGNOSIS — Z20828 Contact with and (suspected) exposure to other viral communicable diseases: Secondary | ICD-10-CM | POA: Diagnosis not present

## 2019-01-07 DIAGNOSIS — N611 Abscess of the breast and nipple: Secondary | ICD-10-CM | POA: Diagnosis not present

## 2019-01-07 DIAGNOSIS — F419 Anxiety disorder, unspecified: Secondary | ICD-10-CM | POA: Diagnosis not present

## 2019-01-07 DIAGNOSIS — R197 Diarrhea, unspecified: Secondary | ICD-10-CM | POA: Diagnosis not present

## 2019-01-07 DIAGNOSIS — K219 Gastro-esophageal reflux disease without esophagitis: Secondary | ICD-10-CM | POA: Diagnosis not present

## 2019-01-07 DIAGNOSIS — N6002 Solitary cyst of left breast: Secondary | ICD-10-CM | POA: Diagnosis not present

## 2019-01-07 DIAGNOSIS — E876 Hypokalemia: Secondary | ICD-10-CM | POA: Diagnosis not present

## 2019-01-07 DIAGNOSIS — F17210 Nicotine dependence, cigarettes, uncomplicated: Secondary | ICD-10-CM | POA: Diagnosis not present

## 2019-01-07 DIAGNOSIS — D72819 Decreased white blood cell count, unspecified: Secondary | ICD-10-CM | POA: Diagnosis not present

## 2019-01-07 DIAGNOSIS — D473 Essential (hemorrhagic) thrombocythemia: Secondary | ICD-10-CM | POA: Diagnosis not present

## 2019-01-07 DIAGNOSIS — R0602 Shortness of breath: Secondary | ICD-10-CM | POA: Diagnosis not present

## 2019-01-07 DIAGNOSIS — I10 Essential (primary) hypertension: Secondary | ICD-10-CM | POA: Diagnosis not present

## 2019-01-07 DIAGNOSIS — R5381 Other malaise: Secondary | ICD-10-CM | POA: Diagnosis not present

## 2019-01-14 DIAGNOSIS — K5792 Diverticulitis of intestine, part unspecified, without perforation or abscess without bleeding: Secondary | ICD-10-CM | POA: Diagnosis not present

## 2019-01-14 DIAGNOSIS — Z8679 Personal history of other diseases of the circulatory system: Secondary | ICD-10-CM | POA: Diagnosis not present

## 2019-01-14 DIAGNOSIS — M6250 Muscle wasting and atrophy, not elsewhere classified, unspecified site: Secondary | ICD-10-CM | POA: Diagnosis not present

## 2019-01-14 DIAGNOSIS — Z20828 Contact with and (suspected) exposure to other viral communicable diseases: Secondary | ICD-10-CM | POA: Diagnosis not present

## 2019-01-14 DIAGNOSIS — I9589 Other hypotension: Secondary | ICD-10-CM | POA: Diagnosis not present

## 2019-01-14 DIAGNOSIS — R000 Tachycardia, unspecified: Secondary | ICD-10-CM | POA: Diagnosis not present

## 2019-01-14 DIAGNOSIS — Z87891 Personal history of nicotine dependence: Secondary | ICD-10-CM | POA: Diagnosis not present

## 2019-01-14 DIAGNOSIS — L0291 Cutaneous abscess, unspecified: Secondary | ICD-10-CM | POA: Diagnosis not present

## 2019-01-14 DIAGNOSIS — R531 Weakness: Secondary | ICD-10-CM | POA: Diagnosis not present

## 2019-01-14 DIAGNOSIS — K5780 Diverticulitis of intestine, part unspecified, with perforation and abscess without bleeding: Secondary | ICD-10-CM | POA: Diagnosis not present

## 2019-01-14 DIAGNOSIS — K5720 Diverticulitis of large intestine with perforation and abscess without bleeding: Secondary | ICD-10-CM | POA: Diagnosis not present

## 2019-01-14 DIAGNOSIS — N739 Female pelvic inflammatory disease, unspecified: Secondary | ICD-10-CM | POA: Diagnosis not present

## 2019-01-14 DIAGNOSIS — F329 Major depressive disorder, single episode, unspecified: Secondary | ICD-10-CM | POA: Diagnosis not present

## 2019-01-14 DIAGNOSIS — R42 Dizziness and giddiness: Secondary | ICD-10-CM | POA: Diagnosis not present

## 2019-01-14 DIAGNOSIS — E785 Hyperlipidemia, unspecified: Secondary | ICD-10-CM | POA: Diagnosis not present

## 2019-01-14 DIAGNOSIS — I10 Essential (primary) hypertension: Secondary | ICD-10-CM | POA: Diagnosis not present

## 2019-01-14 DIAGNOSIS — I959 Hypotension, unspecified: Secondary | ICD-10-CM | POA: Diagnosis not present

## 2019-01-21 ENCOUNTER — Inpatient Hospital Stay: Payer: BC Managed Care – PPO | Admitting: Internal Medicine

## 2019-01-24 ENCOUNTER — Other Ambulatory Visit: Payer: Self-pay | Admitting: Internal Medicine

## 2019-01-24 DIAGNOSIS — F324 Major depressive disorder, single episode, in partial remission: Secondary | ICD-10-CM

## 2019-01-28 DIAGNOSIS — Z4803 Encounter for change or removal of drains: Secondary | ICD-10-CM | POA: Diagnosis not present

## 2019-01-28 DIAGNOSIS — N739 Female pelvic inflammatory disease, unspecified: Secondary | ICD-10-CM | POA: Diagnosis not present

## 2019-02-04 DIAGNOSIS — Z6823 Body mass index (BMI) 23.0-23.9, adult: Secondary | ICD-10-CM | POA: Diagnosis not present

## 2019-02-04 DIAGNOSIS — K5792 Diverticulitis of intestine, part unspecified, without perforation or abscess without bleeding: Secondary | ICD-10-CM | POA: Diagnosis not present

## 2019-02-04 DIAGNOSIS — K219 Gastro-esophageal reflux disease without esophagitis: Secondary | ICD-10-CM | POA: Diagnosis not present

## 2019-02-06 ENCOUNTER — Ambulatory Visit: Payer: BC Managed Care – PPO | Attending: Internal Medicine

## 2019-02-06 DIAGNOSIS — Z20828 Contact with and (suspected) exposure to other viral communicable diseases: Secondary | ICD-10-CM | POA: Diagnosis not present

## 2019-02-06 DIAGNOSIS — Z20822 Contact with and (suspected) exposure to covid-19: Secondary | ICD-10-CM

## 2019-02-08 LAB — NOVEL CORONAVIRUS, NAA: SARS-CoV-2, NAA: NOT DETECTED

## 2019-02-13 DIAGNOSIS — Z20828 Contact with and (suspected) exposure to other viral communicable diseases: Secondary | ICD-10-CM | POA: Diagnosis not present

## 2019-02-13 DIAGNOSIS — R1110 Vomiting, unspecified: Secondary | ICD-10-CM | POA: Diagnosis not present

## 2019-02-20 ENCOUNTER — Other Ambulatory Visit: Payer: Self-pay | Admitting: Internal Medicine

## 2019-02-20 DIAGNOSIS — E782 Mixed hyperlipidemia: Secondary | ICD-10-CM

## 2019-02-20 DIAGNOSIS — K2100 Gastro-esophageal reflux disease with esophagitis, without bleeding: Secondary | ICD-10-CM

## 2019-02-20 DIAGNOSIS — I10 Essential (primary) hypertension: Secondary | ICD-10-CM

## 2019-02-25 DIAGNOSIS — Z01812 Encounter for preprocedural laboratory examination: Secondary | ICD-10-CM | POA: Diagnosis not present

## 2019-02-25 DIAGNOSIS — Z20822 Contact with and (suspected) exposure to covid-19: Secondary | ICD-10-CM | POA: Diagnosis not present

## 2019-02-27 DIAGNOSIS — F329 Major depressive disorder, single episode, unspecified: Secondary | ICD-10-CM | POA: Diagnosis not present

## 2019-02-27 DIAGNOSIS — Z13810 Encounter for screening for upper gastrointestinal disorder: Secondary | ICD-10-CM | POA: Diagnosis not present

## 2019-02-27 DIAGNOSIS — K219 Gastro-esophageal reflux disease without esophagitis: Secondary | ICD-10-CM | POA: Diagnosis not present

## 2019-02-27 DIAGNOSIS — K449 Diaphragmatic hernia without obstruction or gangrene: Secondary | ICD-10-CM | POA: Diagnosis not present

## 2019-02-27 DIAGNOSIS — K6389 Other specified diseases of intestine: Secondary | ICD-10-CM | POA: Diagnosis not present

## 2019-02-27 DIAGNOSIS — K5730 Diverticulosis of large intestine without perforation or abscess without bleeding: Secondary | ICD-10-CM | POA: Diagnosis not present

## 2019-02-27 DIAGNOSIS — K319 Disease of stomach and duodenum, unspecified: Secondary | ICD-10-CM | POA: Diagnosis not present

## 2019-02-27 DIAGNOSIS — K2980 Duodenitis without bleeding: Secondary | ICD-10-CM | POA: Diagnosis not present

## 2019-02-27 DIAGNOSIS — K3189 Other diseases of stomach and duodenum: Secondary | ICD-10-CM | POA: Diagnosis not present

## 2019-02-27 DIAGNOSIS — I10 Essential (primary) hypertension: Secondary | ICD-10-CM | POA: Diagnosis not present

## 2019-04-11 DIAGNOSIS — R1031 Right lower quadrant pain: Secondary | ICD-10-CM | POA: Diagnosis not present

## 2019-04-11 DIAGNOSIS — K5792 Diverticulitis of intestine, part unspecified, without perforation or abscess without bleeding: Secondary | ICD-10-CM | POA: Diagnosis not present

## 2019-04-11 DIAGNOSIS — R1032 Left lower quadrant pain: Secondary | ICD-10-CM | POA: Diagnosis not present

## 2019-04-11 DIAGNOSIS — R52 Pain, unspecified: Secondary | ICD-10-CM | POA: Diagnosis not present

## 2019-04-11 DIAGNOSIS — Z20822 Contact with and (suspected) exposure to covid-19: Secondary | ICD-10-CM | POA: Diagnosis not present

## 2019-04-11 DIAGNOSIS — R509 Fever, unspecified: Secondary | ICD-10-CM | POA: Diagnosis not present

## 2019-04-19 DIAGNOSIS — R509 Fever, unspecified: Secondary | ICD-10-CM | POA: Diagnosis not present

## 2019-04-19 DIAGNOSIS — K5732 Diverticulitis of large intestine without perforation or abscess without bleeding: Secondary | ICD-10-CM | POA: Diagnosis not present

## 2019-04-19 DIAGNOSIS — Z6823 Body mass index (BMI) 23.0-23.9, adult: Secondary | ICD-10-CM | POA: Diagnosis not present

## 2019-04-19 DIAGNOSIS — K5792 Diverticulitis of intestine, part unspecified, without perforation or abscess without bleeding: Secondary | ICD-10-CM | POA: Diagnosis not present

## 2019-04-19 DIAGNOSIS — R109 Unspecified abdominal pain: Secondary | ICD-10-CM | POA: Diagnosis not present

## 2019-04-19 DIAGNOSIS — N739 Female pelvic inflammatory disease, unspecified: Secondary | ICD-10-CM | POA: Diagnosis not present

## 2019-04-22 DIAGNOSIS — Z20822 Contact with and (suspected) exposure to covid-19: Secondary | ICD-10-CM | POA: Diagnosis not present

## 2019-04-22 DIAGNOSIS — Z01812 Encounter for preprocedural laboratory examination: Secondary | ICD-10-CM | POA: Diagnosis not present

## 2019-04-25 DIAGNOSIS — K5792 Diverticulitis of intestine, part unspecified, without perforation or abscess without bleeding: Secondary | ICD-10-CM | POA: Diagnosis not present

## 2019-04-25 DIAGNOSIS — Z87891 Personal history of nicotine dependence: Secondary | ICD-10-CM | POA: Diagnosis not present

## 2019-04-25 DIAGNOSIS — K660 Peritoneal adhesions (postprocedural) (postinfection): Secondary | ICD-10-CM | POA: Diagnosis not present

## 2019-04-25 DIAGNOSIS — I10 Essential (primary) hypertension: Secondary | ICD-10-CM | POA: Diagnosis not present

## 2019-04-25 DIAGNOSIS — K5732 Diverticulitis of large intestine without perforation or abscess without bleeding: Secondary | ICD-10-CM | POA: Diagnosis not present

## 2019-04-25 DIAGNOSIS — K219 Gastro-esophageal reflux disease without esophagitis: Secondary | ICD-10-CM | POA: Diagnosis not present

## 2019-04-25 DIAGNOSIS — K5730 Diverticulosis of large intestine without perforation or abscess without bleeding: Secondary | ICD-10-CM | POA: Diagnosis not present

## 2019-04-25 DIAGNOSIS — G8918 Other acute postprocedural pain: Secondary | ICD-10-CM | POA: Diagnosis not present

## 2019-04-25 DIAGNOSIS — N138 Other obstructive and reflux uropathy: Secondary | ICD-10-CM | POA: Diagnosis not present

## 2019-04-25 DIAGNOSIS — F329 Major depressive disorder, single episode, unspecified: Secondary | ICD-10-CM | POA: Diagnosis not present

## 2019-05-01 DIAGNOSIS — Z960 Presence of urogenital implants: Secondary | ICD-10-CM | POA: Diagnosis not present

## 2019-05-01 DIAGNOSIS — Z48815 Encounter for surgical aftercare following surgery on the digestive system: Secondary | ICD-10-CM | POA: Diagnosis not present

## 2019-05-01 DIAGNOSIS — Z433 Encounter for attention to colostomy: Secondary | ICD-10-CM | POA: Diagnosis not present

## 2019-05-01 DIAGNOSIS — Z9181 History of falling: Secondary | ICD-10-CM | POA: Diagnosis not present

## 2019-05-01 DIAGNOSIS — Z87891 Personal history of nicotine dependence: Secondary | ICD-10-CM | POA: Diagnosis not present

## 2019-05-01 DIAGNOSIS — F1020 Alcohol dependence, uncomplicated: Secondary | ICD-10-CM | POA: Diagnosis not present

## 2019-05-01 DIAGNOSIS — K5720 Diverticulitis of large intestine with perforation and abscess without bleeding: Secondary | ICD-10-CM | POA: Diagnosis not present

## 2019-05-01 DIAGNOSIS — Z9049 Acquired absence of other specified parts of digestive tract: Secondary | ICD-10-CM | POA: Diagnosis not present

## 2019-05-01 DIAGNOSIS — F339 Major depressive disorder, recurrent, unspecified: Secondary | ICD-10-CM | POA: Diagnosis not present

## 2019-05-03 DIAGNOSIS — F339 Major depressive disorder, recurrent, unspecified: Secondary | ICD-10-CM | POA: Diagnosis not present

## 2019-05-03 DIAGNOSIS — Z87891 Personal history of nicotine dependence: Secondary | ICD-10-CM | POA: Diagnosis not present

## 2019-05-03 DIAGNOSIS — Z9049 Acquired absence of other specified parts of digestive tract: Secondary | ICD-10-CM | POA: Diagnosis not present

## 2019-05-03 DIAGNOSIS — Z960 Presence of urogenital implants: Secondary | ICD-10-CM | POA: Diagnosis not present

## 2019-05-03 DIAGNOSIS — Z48815 Encounter for surgical aftercare following surgery on the digestive system: Secondary | ICD-10-CM | POA: Diagnosis not present

## 2019-05-03 DIAGNOSIS — Z433 Encounter for attention to colostomy: Secondary | ICD-10-CM | POA: Diagnosis not present

## 2019-05-03 DIAGNOSIS — F1020 Alcohol dependence, uncomplicated: Secondary | ICD-10-CM | POA: Diagnosis not present

## 2019-05-03 DIAGNOSIS — Z9181 History of falling: Secondary | ICD-10-CM | POA: Diagnosis not present

## 2019-05-03 DIAGNOSIS — K5720 Diverticulitis of large intestine with perforation and abscess without bleeding: Secondary | ICD-10-CM | POA: Diagnosis not present

## 2019-05-06 DIAGNOSIS — F1020 Alcohol dependence, uncomplicated: Secondary | ICD-10-CM | POA: Diagnosis not present

## 2019-05-06 DIAGNOSIS — Z87891 Personal history of nicotine dependence: Secondary | ICD-10-CM | POA: Diagnosis not present

## 2019-05-06 DIAGNOSIS — Z48815 Encounter for surgical aftercare following surgery on the digestive system: Secondary | ICD-10-CM | POA: Diagnosis not present

## 2019-05-06 DIAGNOSIS — Z9181 History of falling: Secondary | ICD-10-CM | POA: Diagnosis not present

## 2019-05-06 DIAGNOSIS — F339 Major depressive disorder, recurrent, unspecified: Secondary | ICD-10-CM | POA: Diagnosis not present

## 2019-05-06 DIAGNOSIS — Z960 Presence of urogenital implants: Secondary | ICD-10-CM | POA: Diagnosis not present

## 2019-05-06 DIAGNOSIS — K5720 Diverticulitis of large intestine with perforation and abscess without bleeding: Secondary | ICD-10-CM | POA: Diagnosis not present

## 2019-05-06 DIAGNOSIS — Z433 Encounter for attention to colostomy: Secondary | ICD-10-CM | POA: Diagnosis not present

## 2019-05-06 DIAGNOSIS — Z9049 Acquired absence of other specified parts of digestive tract: Secondary | ICD-10-CM | POA: Diagnosis not present

## 2019-05-07 DIAGNOSIS — K5732 Diverticulitis of large intestine without perforation or abscess without bleeding: Secondary | ICD-10-CM | POA: Diagnosis not present

## 2019-05-07 DIAGNOSIS — Z933 Colostomy status: Secondary | ICD-10-CM | POA: Diagnosis not present

## 2019-05-13 DIAGNOSIS — Z09 Encounter for follow-up examination after completed treatment for conditions other than malignant neoplasm: Secondary | ICD-10-CM | POA: Diagnosis not present

## 2019-05-15 DIAGNOSIS — Z48815 Encounter for surgical aftercare following surgery on the digestive system: Secondary | ICD-10-CM | POA: Diagnosis not present

## 2019-05-15 DIAGNOSIS — K5720 Diverticulitis of large intestine with perforation and abscess without bleeding: Secondary | ICD-10-CM | POA: Diagnosis not present

## 2019-05-15 DIAGNOSIS — F339 Major depressive disorder, recurrent, unspecified: Secondary | ICD-10-CM | POA: Diagnosis not present

## 2019-05-15 DIAGNOSIS — Z433 Encounter for attention to colostomy: Secondary | ICD-10-CM | POA: Diagnosis not present

## 2019-05-31 DIAGNOSIS — K5792 Diverticulitis of intestine, part unspecified, without perforation or abscess without bleeding: Secondary | ICD-10-CM | POA: Diagnosis not present

## 2019-05-31 DIAGNOSIS — K2100 Gastro-esophageal reflux disease with esophagitis, without bleeding: Secondary | ICD-10-CM | POA: Diagnosis not present

## 2019-05-31 DIAGNOSIS — R110 Nausea: Secondary | ICD-10-CM | POA: Diagnosis not present

## 2019-05-31 DIAGNOSIS — Z6823 Body mass index (BMI) 23.0-23.9, adult: Secondary | ICD-10-CM | POA: Diagnosis not present

## 2019-06-04 DIAGNOSIS — K5732 Diverticulitis of large intestine without perforation or abscess without bleeding: Secondary | ICD-10-CM | POA: Diagnosis not present

## 2019-06-04 DIAGNOSIS — Z933 Colostomy status: Secondary | ICD-10-CM | POA: Diagnosis not present

## 2019-06-12 ENCOUNTER — Ambulatory Visit: Payer: BC Managed Care – PPO

## 2019-06-12 ENCOUNTER — Encounter: Payer: Self-pay | Admitting: Podiatry

## 2019-06-12 DIAGNOSIS — M2012 Hallux valgus (acquired), left foot: Secondary | ICD-10-CM

## 2019-06-12 DIAGNOSIS — M2011 Hallux valgus (acquired), right foot: Secondary | ICD-10-CM

## 2019-06-12 NOTE — Progress Notes (Signed)
This encounter was created in error - please disregard.

## 2019-07-04 DIAGNOSIS — K648 Other hemorrhoids: Secondary | ICD-10-CM | POA: Diagnosis not present

## 2019-07-04 DIAGNOSIS — K5730 Diverticulosis of large intestine without perforation or abscess without bleeding: Secondary | ICD-10-CM | POA: Diagnosis not present

## 2019-07-04 DIAGNOSIS — Z1211 Encounter for screening for malignant neoplasm of colon: Secondary | ICD-10-CM | POA: Diagnosis not present

## 2019-07-18 ENCOUNTER — Encounter: Payer: BC Managed Care – PPO | Admitting: Internal Medicine

## 2019-07-26 DIAGNOSIS — Z01818 Encounter for other preprocedural examination: Secondary | ICD-10-CM | POA: Diagnosis not present

## 2019-07-26 DIAGNOSIS — Z9049 Acquired absence of other specified parts of digestive tract: Secondary | ICD-10-CM | POA: Diagnosis not present

## 2019-07-26 DIAGNOSIS — Z933 Colostomy status: Secondary | ICD-10-CM | POA: Diagnosis not present

## 2019-07-26 DIAGNOSIS — R252 Cramp and spasm: Secondary | ICD-10-CM | POA: Diagnosis not present

## 2019-07-26 DIAGNOSIS — Z79899 Other long term (current) drug therapy: Secondary | ICD-10-CM | POA: Diagnosis not present

## 2019-07-28 ENCOUNTER — Other Ambulatory Visit: Payer: Self-pay | Admitting: Internal Medicine

## 2019-07-28 DIAGNOSIS — F5101 Primary insomnia: Secondary | ICD-10-CM

## 2019-07-28 DIAGNOSIS — F324 Major depressive disorder, single episode, in partial remission: Secondary | ICD-10-CM

## 2019-08-06 DIAGNOSIS — G8918 Other acute postprocedural pain: Secondary | ICD-10-CM | POA: Diagnosis not present

## 2019-08-06 DIAGNOSIS — N138 Other obstructive and reflux uropathy: Secondary | ICD-10-CM | POA: Diagnosis not present

## 2019-08-06 DIAGNOSIS — K6289 Other specified diseases of anus and rectum: Secondary | ICD-10-CM | POA: Diagnosis not present

## 2019-08-06 DIAGNOSIS — I10 Essential (primary) hypertension: Secondary | ICD-10-CM | POA: Diagnosis not present

## 2019-08-06 DIAGNOSIS — K5792 Diverticulitis of intestine, part unspecified, without perforation or abscess without bleeding: Secondary | ICD-10-CM | POA: Diagnosis not present

## 2019-08-06 DIAGNOSIS — K5720 Diverticulitis of large intestine with perforation and abscess without bleeding: Secondary | ICD-10-CM | POA: Diagnosis not present

## 2019-08-06 DIAGNOSIS — T8189XA Other complications of procedures, not elsewhere classified, initial encounter: Secondary | ICD-10-CM | POA: Diagnosis not present

## 2019-08-06 DIAGNOSIS — K660 Peritoneal adhesions (postprocedural) (postinfection): Secondary | ICD-10-CM | POA: Diagnosis not present

## 2019-08-06 DIAGNOSIS — N736 Female pelvic peritoneal adhesions (postinfective): Secondary | ICD-10-CM | POA: Diagnosis not present

## 2019-08-06 DIAGNOSIS — F17210 Nicotine dependence, cigarettes, uncomplicated: Secondary | ICD-10-CM | POA: Diagnosis not present

## 2019-08-06 DIAGNOSIS — Z433 Encounter for attention to colostomy: Secondary | ICD-10-CM | POA: Diagnosis not present

## 2019-08-11 DIAGNOSIS — Z9049 Acquired absence of other specified parts of digestive tract: Secondary | ICD-10-CM | POA: Diagnosis not present

## 2019-08-11 DIAGNOSIS — F1020 Alcohol dependence, uncomplicated: Secondary | ICD-10-CM | POA: Diagnosis not present

## 2019-08-11 DIAGNOSIS — E785 Hyperlipidemia, unspecified: Secondary | ICD-10-CM | POA: Diagnosis not present

## 2019-08-11 DIAGNOSIS — K5732 Diverticulitis of large intestine without perforation or abscess without bleeding: Secondary | ICD-10-CM | POA: Diagnosis not present

## 2019-08-11 DIAGNOSIS — I10 Essential (primary) hypertension: Secondary | ICD-10-CM | POA: Diagnosis not present

## 2019-08-11 DIAGNOSIS — F339 Major depressive disorder, recurrent, unspecified: Secondary | ICD-10-CM | POA: Diagnosis not present

## 2019-08-11 DIAGNOSIS — F17210 Nicotine dependence, cigarettes, uncomplicated: Secondary | ICD-10-CM | POA: Diagnosis not present

## 2019-08-11 DIAGNOSIS — Z48815 Encounter for surgical aftercare following surgery on the digestive system: Secondary | ICD-10-CM | POA: Diagnosis not present

## 2019-08-14 DIAGNOSIS — I10 Essential (primary) hypertension: Secondary | ICD-10-CM | POA: Diagnosis not present

## 2019-08-14 DIAGNOSIS — K5732 Diverticulitis of large intestine without perforation or abscess without bleeding: Secondary | ICD-10-CM | POA: Diagnosis not present

## 2019-08-14 DIAGNOSIS — Z9049 Acquired absence of other specified parts of digestive tract: Secondary | ICD-10-CM | POA: Diagnosis not present

## 2019-08-14 DIAGNOSIS — F339 Major depressive disorder, recurrent, unspecified: Secondary | ICD-10-CM | POA: Diagnosis not present

## 2019-08-14 DIAGNOSIS — F17210 Nicotine dependence, cigarettes, uncomplicated: Secondary | ICD-10-CM | POA: Diagnosis not present

## 2019-08-14 DIAGNOSIS — Z48815 Encounter for surgical aftercare following surgery on the digestive system: Secondary | ICD-10-CM | POA: Diagnosis not present

## 2019-08-14 DIAGNOSIS — E785 Hyperlipidemia, unspecified: Secondary | ICD-10-CM | POA: Diagnosis not present

## 2019-08-14 DIAGNOSIS — F1020 Alcohol dependence, uncomplicated: Secondary | ICD-10-CM | POA: Diagnosis not present

## 2019-08-16 DIAGNOSIS — K5792 Diverticulitis of intestine, part unspecified, without perforation or abscess without bleeding: Secondary | ICD-10-CM | POA: Diagnosis not present

## 2019-08-16 DIAGNOSIS — Z09 Encounter for follow-up examination after completed treatment for conditions other than malignant neoplasm: Secondary | ICD-10-CM | POA: Diagnosis not present

## 2019-08-21 DIAGNOSIS — E785 Hyperlipidemia, unspecified: Secondary | ICD-10-CM | POA: Diagnosis not present

## 2019-08-21 DIAGNOSIS — I10 Essential (primary) hypertension: Secondary | ICD-10-CM | POA: Diagnosis not present

## 2019-08-21 DIAGNOSIS — K5732 Diverticulitis of large intestine without perforation or abscess without bleeding: Secondary | ICD-10-CM | POA: Diagnosis not present

## 2019-08-21 DIAGNOSIS — F1020 Alcohol dependence, uncomplicated: Secondary | ICD-10-CM | POA: Diagnosis not present

## 2019-08-21 DIAGNOSIS — F17210 Nicotine dependence, cigarettes, uncomplicated: Secondary | ICD-10-CM | POA: Diagnosis not present

## 2019-08-21 DIAGNOSIS — F339 Major depressive disorder, recurrent, unspecified: Secondary | ICD-10-CM | POA: Diagnosis not present

## 2019-08-21 DIAGNOSIS — Z48815 Encounter for surgical aftercare following surgery on the digestive system: Secondary | ICD-10-CM | POA: Diagnosis not present

## 2019-08-21 DIAGNOSIS — Z9049 Acquired absence of other specified parts of digestive tract: Secondary | ICD-10-CM | POA: Diagnosis not present

## 2019-08-23 DIAGNOSIS — R112 Nausea with vomiting, unspecified: Secondary | ICD-10-CM | POA: Diagnosis not present

## 2019-08-23 DIAGNOSIS — R109 Unspecified abdominal pain: Secondary | ICD-10-CM | POA: Diagnosis not present

## 2019-08-26 DIAGNOSIS — K5732 Diverticulitis of large intestine without perforation or abscess without bleeding: Secondary | ICD-10-CM | POA: Diagnosis not present

## 2019-08-26 DIAGNOSIS — Z9049 Acquired absence of other specified parts of digestive tract: Secondary | ICD-10-CM | POA: Diagnosis not present

## 2019-08-26 DIAGNOSIS — E785 Hyperlipidemia, unspecified: Secondary | ICD-10-CM | POA: Diagnosis not present

## 2019-08-26 DIAGNOSIS — I10 Essential (primary) hypertension: Secondary | ICD-10-CM | POA: Diagnosis not present

## 2019-08-26 DIAGNOSIS — Z48815 Encounter for surgical aftercare following surgery on the digestive system: Secondary | ICD-10-CM | POA: Diagnosis not present

## 2019-08-26 DIAGNOSIS — F1020 Alcohol dependence, uncomplicated: Secondary | ICD-10-CM | POA: Diagnosis not present

## 2019-08-26 DIAGNOSIS — F339 Major depressive disorder, recurrent, unspecified: Secondary | ICD-10-CM | POA: Diagnosis not present

## 2019-08-26 DIAGNOSIS — F17210 Nicotine dependence, cigarettes, uncomplicated: Secondary | ICD-10-CM | POA: Diagnosis not present

## 2019-08-29 DIAGNOSIS — F1020 Alcohol dependence, uncomplicated: Secondary | ICD-10-CM | POA: Diagnosis not present

## 2019-08-29 DIAGNOSIS — R112 Nausea with vomiting, unspecified: Secondary | ICD-10-CM | POA: Diagnosis not present

## 2019-08-29 DIAGNOSIS — Z433 Encounter for attention to colostomy: Secondary | ICD-10-CM | POA: Diagnosis not present

## 2019-08-29 DIAGNOSIS — K5732 Diverticulitis of large intestine without perforation or abscess without bleeding: Secondary | ICD-10-CM | POA: Diagnosis not present

## 2019-08-29 DIAGNOSIS — Z48815 Encounter for surgical aftercare following surgery on the digestive system: Secondary | ICD-10-CM | POA: Diagnosis not present

## 2019-08-29 DIAGNOSIS — R109 Unspecified abdominal pain: Secondary | ICD-10-CM | POA: Diagnosis not present

## 2019-08-29 DIAGNOSIS — F339 Major depressive disorder, recurrent, unspecified: Secondary | ICD-10-CM | POA: Diagnosis not present

## 2019-09-04 DIAGNOSIS — Z9049 Acquired absence of other specified parts of digestive tract: Secondary | ICD-10-CM | POA: Diagnosis not present

## 2019-09-04 DIAGNOSIS — Z48815 Encounter for surgical aftercare following surgery on the digestive system: Secondary | ICD-10-CM | POA: Diagnosis not present

## 2019-09-04 DIAGNOSIS — F1020 Alcohol dependence, uncomplicated: Secondary | ICD-10-CM | POA: Diagnosis not present

## 2019-09-04 DIAGNOSIS — I10 Essential (primary) hypertension: Secondary | ICD-10-CM | POA: Diagnosis not present

## 2019-09-04 DIAGNOSIS — K5732 Diverticulitis of large intestine without perforation or abscess without bleeding: Secondary | ICD-10-CM | POA: Diagnosis not present

## 2019-09-04 DIAGNOSIS — F339 Major depressive disorder, recurrent, unspecified: Secondary | ICD-10-CM | POA: Diagnosis not present

## 2019-09-04 DIAGNOSIS — F17210 Nicotine dependence, cigarettes, uncomplicated: Secondary | ICD-10-CM | POA: Diagnosis not present

## 2019-09-04 DIAGNOSIS — E785 Hyperlipidemia, unspecified: Secondary | ICD-10-CM | POA: Diagnosis not present

## 2019-09-06 DIAGNOSIS — Z933 Colostomy status: Secondary | ICD-10-CM | POA: Diagnosis not present

## 2019-09-06 DIAGNOSIS — R509 Fever, unspecified: Secondary | ICD-10-CM | POA: Diagnosis not present

## 2019-09-06 DIAGNOSIS — K5792 Diverticulitis of intestine, part unspecified, without perforation or abscess without bleeding: Secondary | ICD-10-CM | POA: Diagnosis not present

## 2019-09-06 DIAGNOSIS — R188 Other ascites: Secondary | ICD-10-CM | POA: Diagnosis not present

## 2019-09-07 ENCOUNTER — Other Ambulatory Visit: Payer: Self-pay | Admitting: Internal Medicine

## 2019-09-07 DIAGNOSIS — F5101 Primary insomnia: Secondary | ICD-10-CM

## 2019-09-07 NOTE — Telephone Encounter (Signed)
Courtesy RF- please advise pt to call office to make an appt for further RF Requested Prescriptions  Pending Prescriptions Disp Refills  . traZODone (DESYREL) 50 MG tablet [Pharmacy Med Name: TRAZODONE 50 MG TABLET] 30 tablet 0    Sig: TAKE 1 TABLET (50 MG TOTAL) BY MOUTH AT BEDTIME AS NEEDED FOR SLEEP.     Psychiatry: Antidepressants - Serotonin Modulator Failed - 09/07/2019 11:23 AM      Failed - Valid encounter within last 6 months    Recent Outpatient Visits          8 months ago Diverticulitis of large intestine without perforation or abscess with bleeding   [facility] Glean Hess, MD   1 year ago Annual physical exam   [facility] Glean Hess, MD   1 year ago [facility] Glean Hess, MD   1 year ago Mixed hyperlipidemia   [facility] Glean Hess, MD   1 year ago Major depressive disorder with single episode, in partial remission [facility])   [facility] Glean Hess, MD             Passed - Completed PHQ-2 or PHQ-9 in the last 360 days.

## 2019-09-17 DIAGNOSIS — R509 Fever, unspecified: Secondary | ICD-10-CM | POA: Diagnosis not present

## 2019-09-17 DIAGNOSIS — R188 Other ascites: Secondary | ICD-10-CM | POA: Diagnosis not present

## 2019-09-17 DIAGNOSIS — F329 Major depressive disorder, single episode, unspecified: Secondary | ICD-10-CM | POA: Diagnosis not present

## 2019-09-17 DIAGNOSIS — I10 Essential (primary) hypertension: Secondary | ICD-10-CM | POA: Diagnosis not present

## 2019-09-29 ENCOUNTER — Other Ambulatory Visit: Payer: Self-pay | Admitting: Physician Assistant

## 2019-09-29 DIAGNOSIS — F5101 Primary insomnia: Secondary | ICD-10-CM

## 2019-10-04 DIAGNOSIS — K219 Gastro-esophageal reflux disease without esophagitis: Secondary | ICD-10-CM | POA: Diagnosis not present

## 2019-10-04 DIAGNOSIS — I10 Essential (primary) hypertension: Secondary | ICD-10-CM | POA: Diagnosis not present

## 2019-10-04 DIAGNOSIS — Z114 Encounter for screening for human immunodeficiency virus [HIV]: Secondary | ICD-10-CM | POA: Diagnosis not present

## 2019-10-04 DIAGNOSIS — Z23 Encounter for immunization: Secondary | ICD-10-CM | POA: Diagnosis not present

## 2019-10-04 DIAGNOSIS — F419 Anxiety disorder, unspecified: Secondary | ICD-10-CM | POA: Diagnosis not present

## 2019-10-04 DIAGNOSIS — Z1159 Encounter for screening for other viral diseases: Secondary | ICD-10-CM | POA: Diagnosis not present

## 2019-10-04 DIAGNOSIS — E7800 Pure hypercholesterolemia, unspecified: Secondary | ICD-10-CM | POA: Diagnosis not present

## 2019-10-04 DIAGNOSIS — Z0000 Encounter for general adult medical examination without abnormal findings: Secondary | ICD-10-CM | POA: Diagnosis not present

## 2019-10-09 ENCOUNTER — Other Ambulatory Visit: Payer: Self-pay | Admitting: Physician Assistant

## 2019-10-09 DIAGNOSIS — Z1231 Encounter for screening mammogram for malignant neoplasm of breast: Secondary | ICD-10-CM

## 2019-10-18 DIAGNOSIS — K5792 Diverticulitis of intestine, part unspecified, without perforation or abscess without bleeding: Secondary | ICD-10-CM | POA: Diagnosis not present

## 2019-12-27 DIAGNOSIS — R300 Dysuria: Secondary | ICD-10-CM | POA: Diagnosis not present

## 2019-12-27 DIAGNOSIS — R110 Nausea: Secondary | ICD-10-CM | POA: Diagnosis not present

## 2019-12-27 DIAGNOSIS — Z23 Encounter for immunization: Secondary | ICD-10-CM | POA: Diagnosis not present

## 2019-12-27 DIAGNOSIS — I10 Essential (primary) hypertension: Secondary | ICD-10-CM | POA: Diagnosis not present

## 2019-12-31 ENCOUNTER — Ambulatory Visit
Admission: EM | Admit: 2019-12-31 | Discharge: 2019-12-31 | Disposition: A | Discharge status: Home / Self Care | Payer: BC Managed Care – PPO | Attending: Physician Assistant | Admitting: Physician Assistant

## 2019-12-31 ENCOUNTER — Other Ambulatory Visit: Payer: Self-pay

## 2019-12-31 ENCOUNTER — Encounter: Payer: Self-pay | Admitting: Emergency Medicine

## 2019-12-31 ENCOUNTER — Ambulatory Visit (INDEPENDENT_AMBULATORY_CARE_PROVIDER_SITE_OTHER): Payer: BC Managed Care – PPO

## 2019-12-31 DIAGNOSIS — W19XXXA Unspecified fall, initial encounter: Secondary | ICD-10-CM | POA: Diagnosis not present

## 2019-12-31 DIAGNOSIS — S2231XA Fracture of one rib, right side, initial encounter for closed fracture: Secondary | ICD-10-CM

## 2019-12-31 DIAGNOSIS — M549 Dorsalgia, unspecified: Secondary | ICD-10-CM | POA: Diagnosis not present

## 2019-12-31 DIAGNOSIS — S299XXA Unspecified injury of thorax, initial encounter: Secondary | ICD-10-CM

## 2019-12-31 DIAGNOSIS — Z043 Encounter for examination and observation following other accident: Secondary | ICD-10-CM | POA: Diagnosis not present

## 2019-12-31 HISTORY — DX: Diverticulitis of intestine, part unspecified, without perforation or abscess without bleeding: K57.92

## 2019-12-31 MED ORDER — HYDROCODONE-ACETAMINOPHEN 5-325 MG PO TABS: 1.0000 | ORAL_TABLET | Freq: Four times a day (QID) | ORAL | 0 refills | Status: AC | PRN

## 2019-12-31 MED ORDER — IBUPROFEN 600 MG PO TABS: 600.0000 mg | ORAL_TABLET | Freq: Three times a day (TID) | ORAL | 0 refills | Status: AC | PRN

## 2019-12-31 NOTE — ED Triage Notes (Signed)
Patient states she fell at work last Tuesday. She states she has continued to have right sided rib pain and pain in her back.

## 2019-12-31 NOTE — Discharge Instructions (Signed)

## 2019-12-31 NOTE — ED Provider Notes (Signed)
[facility]    CSN: [id number] Arrival date & time: 12/31/19  1657      History   Chief Complaint Chief Complaint  Patient presents with  . Fall  . Back Pain    HPI Haley Mejia is a 54 y.o. female presenting for right anterior and lateral rib pain for the past week.  Patient states that she got her legs tangled up in some cords and fell onto her right side.  She states that she thinks she jabbed her elbow into her side.  Patient says that the pain seems to be getting worse over the past week.  She also admits to some chronic upper back pain.  She says the upper back pain seems to be a little worse since she fell.  Denies any head injury or loss of consciousness.  Denies taking anything for pain.  Denies any difficulty breathing.  Patient states that she does have increased pain in the ribs when she takes a deep breath, coughs, or sneezes.  No associated fever cough.  No history of fracture of the ribs.  No other complaints or concerns.  HPI  Past Medical History:  Diagnosis Date  . Anxiety   . Chest pain with high risk for cardiac etiology 10/23/2015   11/2015: ECHO INTERPRETATION Normal Stress Echocardiogram NORMAL RIGHT VENTRICULAR SYSTOLIC FUNCTION MILD VALVULAR REGURGITATION (See above) NO VALVULAR STENOSIS NOTED  . Depression   . Diverticulitis   . Hypertension     Patient Active Problem List   Diagnosis Date Noted  . ASCUS with positive high risk HPV cervical 03/03/2017  . Adenomatous colon polyp 10/23/2015  . Tobacco use disorder 10/23/2015  . Gastritis determined by endoscopy 10/23/2015  . Functional diarrhea 10/23/2015  . Insomnia 10/23/2015  . Vitamin D deficiency 06/09/2015  . Mixed hyperlipidemia 06/09/2015  . Esophageal stricture 06/08/2015  . Overweight (BMI 25.0-29.9) 06/08/2015  . Anxiety disorder 05/29/2014  . History of cervical cancer 05/29/2014  . Major depression single episode, in partial remission ([place]) 05/29/2014  . Essential  hypertension 05/29/2014    Past Surgical History:  Procedure Laterality Date  . CERVICAL CONIZATION W/BX  1993   for cervical cancer  . COLON SURGERY      OB History   No obstetric history on file.      Home Medications    Prior to Admission medications   Medication Sig Start Date End Date Taking? Authorizing Provider  lisinopril-hydrochlorothiazide (ZESTORETIC) 10-12.5 MG tablet TAKE 1 TABLET BY MOUTH EVERY DAY 02/21/19  Yes Glean Hess, MD  pantoprazole (PROTONIX) 40 MG tablet TAKE 1 TABLET BY MOUTH EVERY DAY 02/21/19  Yes Glean Hess, MD  pravastatin (PRAVACHOL) 40 MG tablet TAKE 1 TABLET BY MOUTH EVERY DAY 02/21/19  Yes Glean Hess, MD  sertraline (ZOLOFT) 100 MG tablet TAKE 1 TABLET BY MOUTH EVERY DAY 01/24/19  Yes Glean Hess, MD  traZODone (DESYREL) 50 MG tablet TAKE 1 TABLET (50 MG TOTAL) BY MOUTH AT BEDTIME AS NEEDED FOR SLEEP. 09/07/19  Yes Fenton Malling M, PA-C  ciprofloxacin (CIPRO) 500 MG tablet Take 500 mg by mouth 2 (two) times daily.    [provider]  doxycycline (DORYX) 100 MG EC tablet Take 100 mg by mouth 2 (two) times daily.    [provider]  Gauze Pads & Dressings (CURITY IODOFORM PACKING STRIP) MISC 1 application by Does not apply route daily. 12/31/18   Glean Hess, MD  HYDROcodone-acetaminophen (NORCO/VICODIN) 5-325 MG tablet Take  1 tablet by mouth every 6 (six) hours as needed for up to 5 days. 12/31/19 01/05/20  Laurene Footman B, PA-C  ibuprofen (ADVIL) 600 MG tablet Take 1 tablet (600 mg total) by mouth every 8 (eight) hours as needed for up to 10 days. 12/31/19 01/10/20  Laurene Footman B, PA-C  metroNIDAZOLE (FLAGYL) 500 MG tablet Take 500 mg by mouth 3 (three) times daily.    [provider]  ondansetron (ZOFRAN ODT) 8 MG disintegrating tablet Take 1 tablet (8 mg total) by mouth every 8 (eight) hours as needed for nausea or vomiting. 12/31/18   Glean Hess, MD    Family History Family  History  Problem Relation Age of Onset  . Alzheimer's disease Mother     Social History Social History   Tobacco Use  . Smoking status: Former Smoker    Packs/day: 0.50    Years: 20.00    Pack years: 10.00    Types: Cigarettes    Start date: 02/14/1997    Quit date: 12/24/2018    Years since quitting: 1.0  . Smokeless tobacco: Never Used  Vaping Use  . Vaping Use: Never used  Substance Use Topics  . Alcohol use: Yes    Alcohol/week: 7.0 standard drinks    Types: 7 Glasses of wine per week    Comment: 1-2 glasses of wine a night  . Drug use: No     Allergies   Patient has no known allergies.   Review of Systems Review of Systems  Constitutional: Negative for fatigue and fever.  Respiratory: Negative for cough, chest tightness, shortness of breath and wheezing.   Cardiovascular: Positive for chest pain (right rib pain).  Musculoskeletal: Positive for back pain.  Skin: Negative for color change, rash and wound.  Neurological: Negative for dizziness, syncope, weakness, numbness and headaches.  Hematological: Does not bruise/bleed easily.     Physical Exam Triage Vital Signs ED Triage Vitals  Enc Vitals Group     BP 12/31/19 1721 (!) 139/99     Pulse Rate 12/31/19 1721 98     Resp 12/31/19 1721 18     Temp 12/31/19 1721 98.2 F (36.8 C)     Temp Source 12/31/19 1721 Oral     SpO2 12/31/19 1721 98 %     Weight 12/31/19 1719 144 lb (65.3 kg)     Height 12/31/19 1719 5\' 4"  (1.626 m)     Head Circumference --      Peak Flow --      Pain Score 12/31/19 1718 6     Pain Loc --      Pain Edu? --      Excl. in [place]? --    No data found.  Updated Vital Signs BP (!) 139/99 (BP Location: Right Arm)   Pulse 98   Temp 98.2 F (36.8 C) (Oral)   Resp 18   Ht 5\' 4"  (1.626 m)   Wt 144 lb (65.3 kg)   SpO2 98%   BMI 24.72 kg/m    Physical Exam Vitals and nursing note reviewed.  Constitutional:      General: She is not in acute distress.    Appearance: Normal  appearance. She is not ill-appearing or toxic-appearing.  HENT:     Head: Normocephalic and atraumatic.  Eyes:     General: No scleral icterus.       Right eye: No discharge.        Left eye: No discharge.  Conjunctiva/sclera: Conjunctivae normal.  Cardiovascular:     Rate and Rhythm: Normal rate and regular rhythm.     Heart sounds: Normal heart sounds.  Pulmonary:     Effort: Pulmonary effort is normal. No respiratory distress.     Breath sounds: Normal breath sounds. No wheezing, rhonchi or rales.  Chest:     Chest wall: Tenderness (TTP right anterior 8-9th ribs. No step offs, no swelling or ecchymosis ) present.  Musculoskeletal:     Cervical back: Neck supple.     Thoracic back: Normal. No tenderness.     Lumbar back: Normal. No tenderness.  Skin:    General: Skin is dry.  Neurological:     General: No focal deficit present.     Mental Status: She is alert. Mental status is at baseline.     Motor: No weakness.     Gait: Gait normal.  Psychiatric:        Mood and Affect: Mood normal.        Behavior: Behavior normal.        Thought Content: Thought content normal.      UC Treatments / Results  Labs (all labs ordered are listed, but only abnormal results are displayed) Labs Reviewed - No data to display  EKG   Radiology DG Ribs Unilateral W/Chest Right  Result Date: 12/31/2019 CLINICAL DATA:  Fall EXAM: RIGHT RIBS AND CHEST - 3+ VIEW COMPARISON:  10/23/2015 FINDINGS: Single-view chest demonstrates no focal opacity or pleural effusion. Normal heart size. No pneumothorax. Right rib series demonstrates possible subtle nondisplaced right ninth lateral rib fracture. IMPRESSION: Possible subtle nondisplaced right ninth lateral rib fracture. No pneumothorax. Electronically Signed   By: Donavan Foil M.D.   On: 12/31/2019 18:15    Procedures Procedures (including critical care time)  Medications Ordered in UC Medications - No data to display  Initial Impression /  Assessment and Plan / UC Course  I have reviewed the triage vital signs and the nursing notes.  Pertinent labs & imaging results that were available during my care of the patient were reviewed by me and considered in my medical decision making (see chart for details).   54 year old female presenting for right-sided rib pain post fall a week ago.  Rib series and chest x-ray performed which shows subtle nondisplaced fracture of the ninth rib.  She has tenderness in this area.  Patient does not have any back tenderness on exam.  Full range of motion of her back.  Discussed results of x-ray with patient.  Advised supportive care for rib fracture.  Patient given ibuprofen for pain relief.  Patient given hydrocodone for severe breakthrough pain.  Controlled substance database reviewed.  Advised her to make sure she is taking several deep breaths throughout the day to prevent atelectasis.  Advised to follow-up with [place] for any signs of pneumonia or any worsening pain or other issues.  Advised follow-up with PCP for chronic back pain.  Final Clinical Impressions(s) / UC Diagnoses   Final diagnoses:  Closed fracture of one rib of right side, initial encounter  Upper back pain     Discharge Instructions     BACK PAIN: Stressed avoiding painful activities . RICE (REST, ICE, COMPRESSION, ELEVATION) guidelines reviewed. May alternate ice and heat. Consider use of muscle rubs, Salonpas patches, etc. Use medications as directed including muscle relaxers if prescribed. Take anti-inflammatory medications as prescribed or OTC NSAIDs/Tylenol.  F/u with PCP in 7-10 days for reexamination, and please feel free to  call or return to the urgent care at any time for any questions or concerns you may have and we will be happy to help you!   BACK PAIN RED FLAGS: If the back pain acutely worsens or there are any red flag symptoms such as numbness/tingling, leg weakness, saddle anesthesia, or loss of bowel/bladder control,  go immediately to the ER. Follow up with [place] as scheduled or sooner if the pain does not begin to resolve or if it worsens before the follow up      ED Prescriptions    Medication Sig Dispense Auth. Provider   HYDROcodone-acetaminophen (NORCO/VICODIN) 5-325 MG tablet Take 1 tablet by mouth every 6 (six) hours as needed for up to 5 days. 12 tablet Laurene Footman B, PA-C   ibuprofen (ADVIL) 600 MG tablet Take 1 tablet (600 mg total) by mouth every 8 (eight) hours as needed for up to 10 days. 30 tablet Danton Clap, PA-C     I have reviewed the PDMP during this encounter.   Danton Clap, PA-C 12/31/19 1912

## 2021-06-21 IMAGING — CR DG RIBS W/ CHEST 3+V*R*
6 series · 6 of 6 positions shown · non-contrast
Comparison: 10/23/2015

CLINICAL DATA: Fall

EXAM:
RIGHT RIBS AND CHEST - 3+ VIEW

[chest pa (1 of 2)]
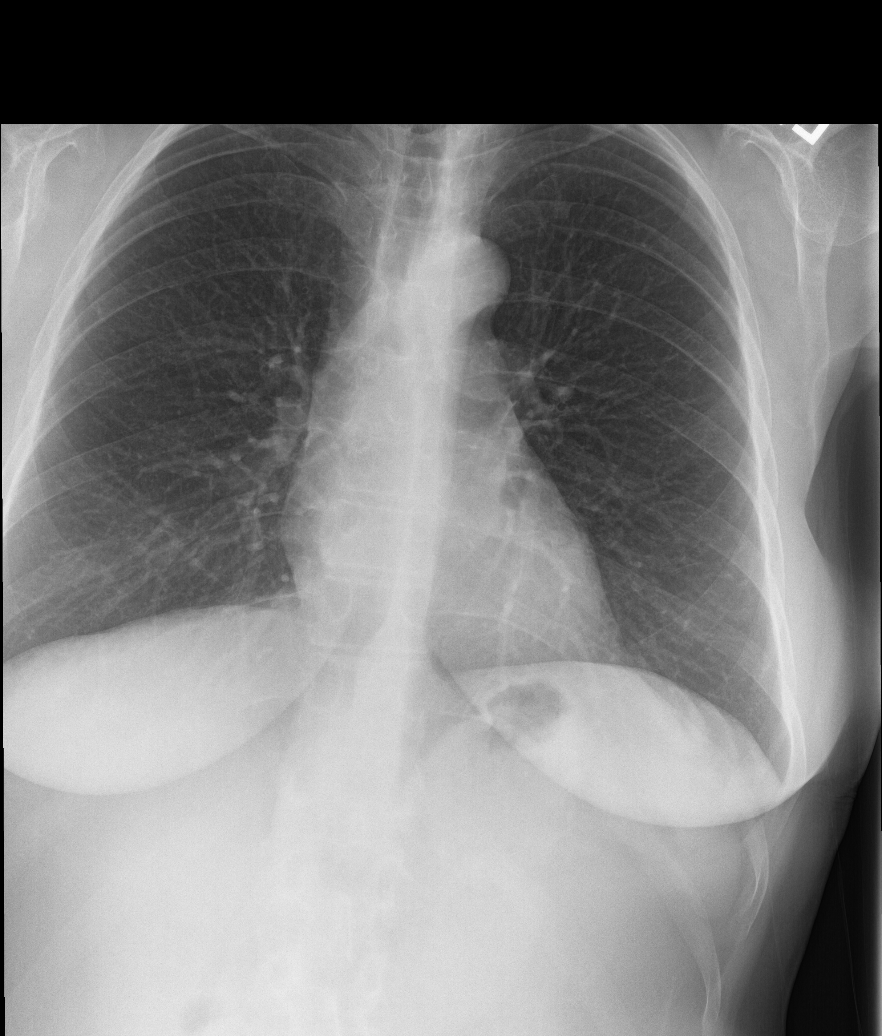

[rib pa (1 of 2)]
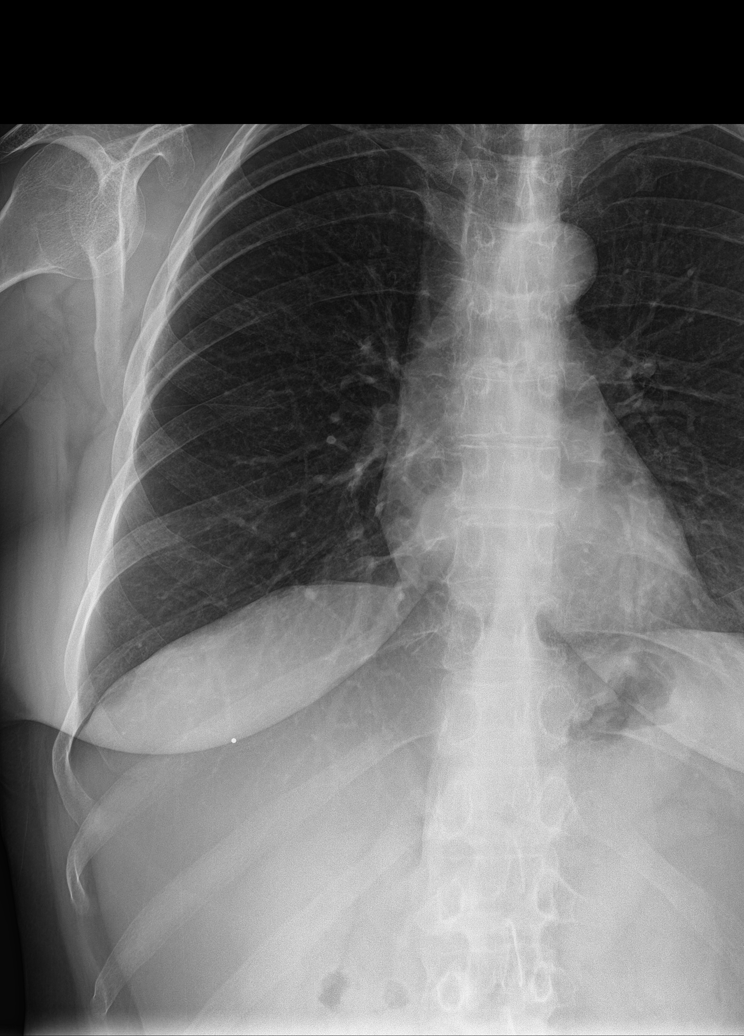

[rib pa (2 of 2)]
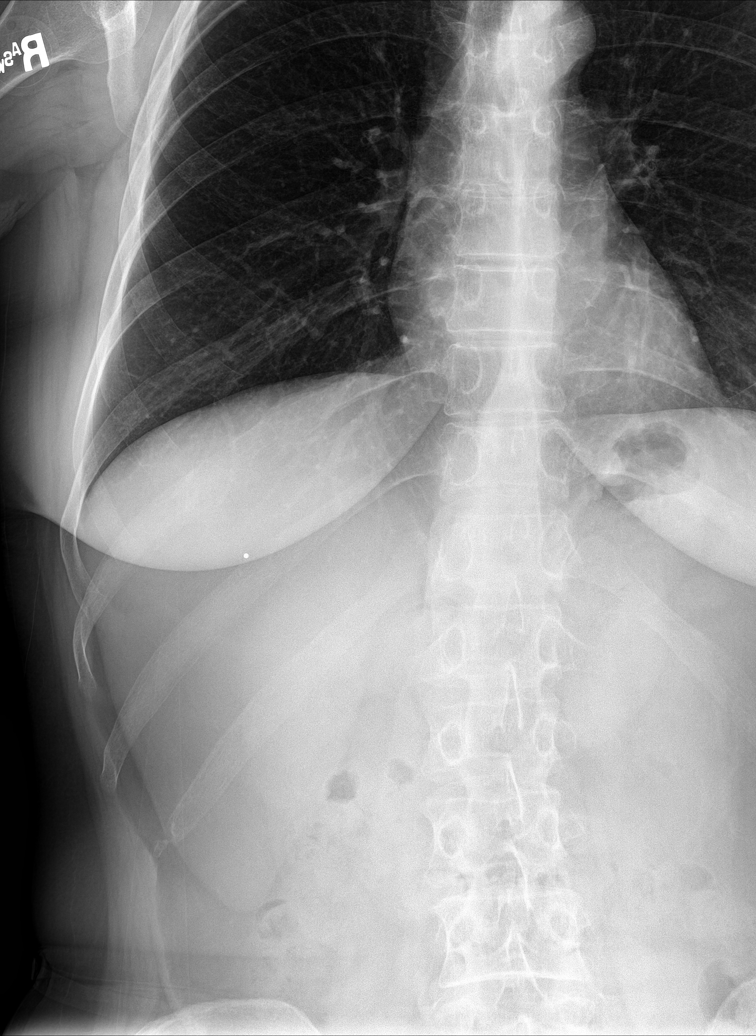

[rib obl (1 of 2)]
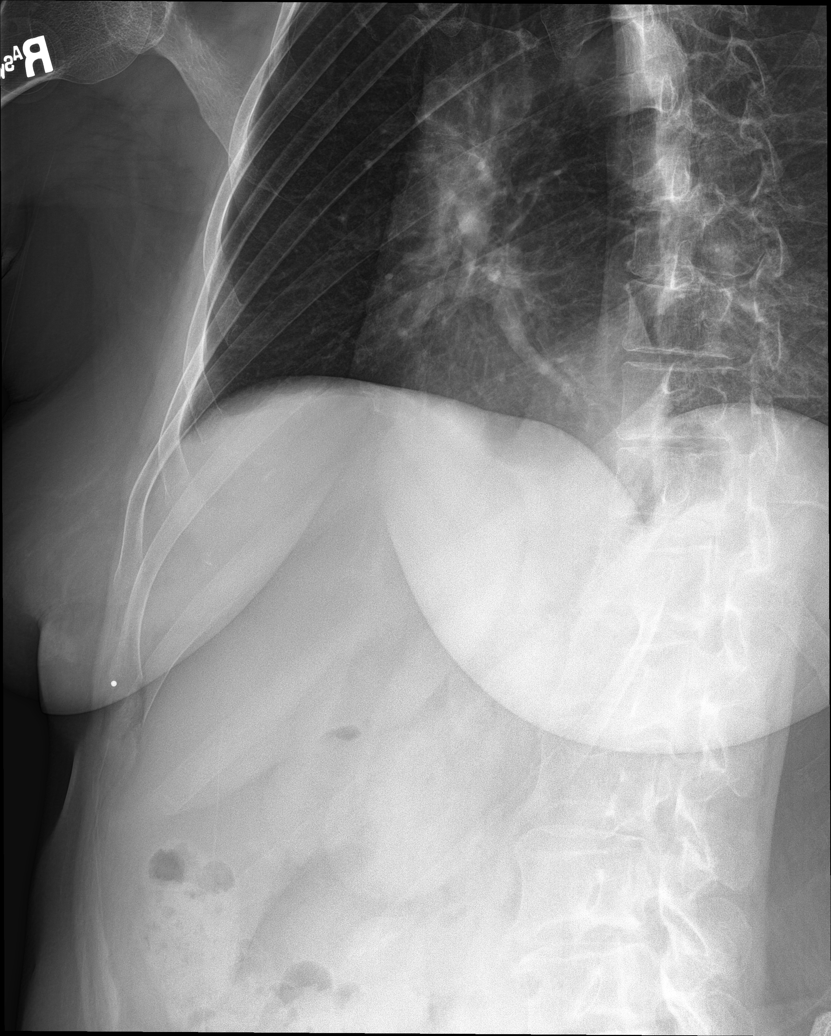

[rib obl (2 of 2)]
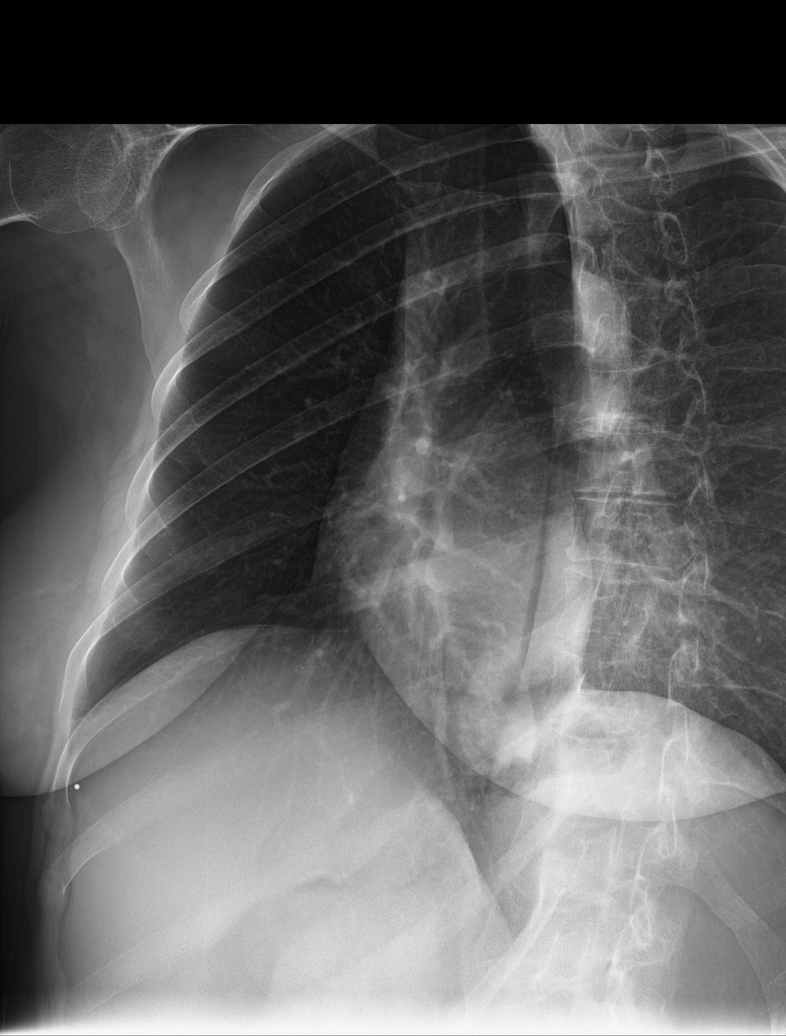

[chest pa (2 of 2)]
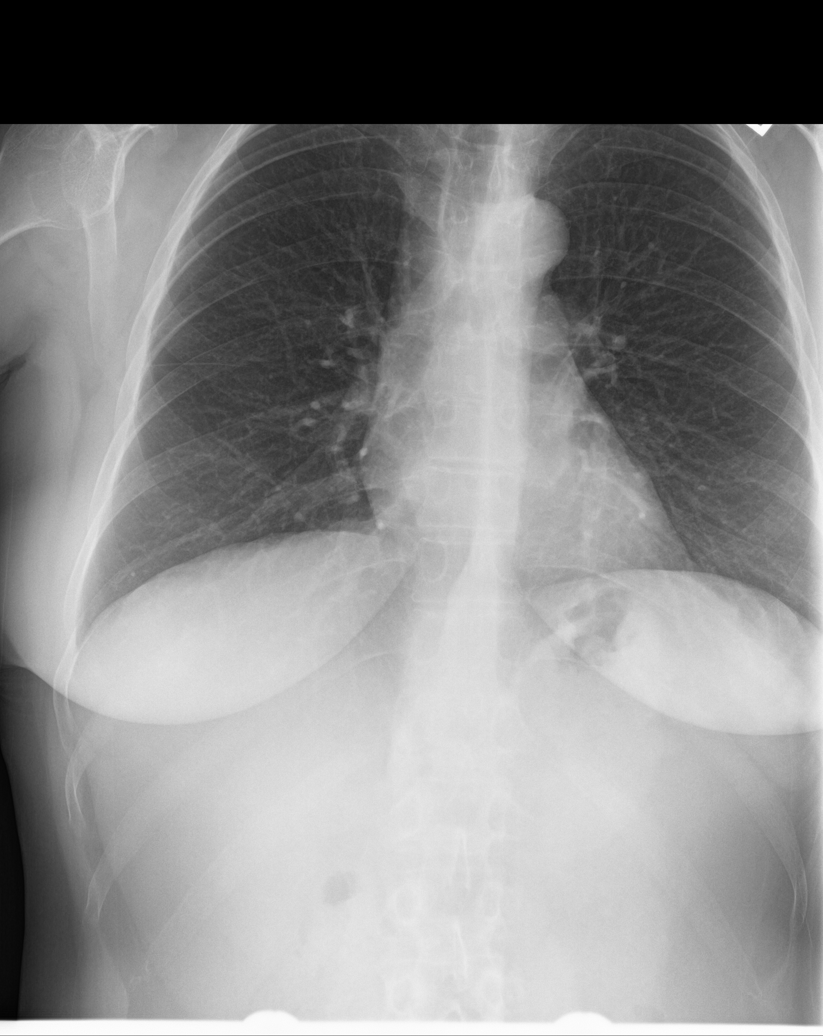

[6 of 6 positions shown; findings below may reference images not displayed]

FINDINGS: Single-view chest demonstrates no focal opacity or pleural effusion.
Normal heart size. No pneumothorax.

Right rib series demonstrates possible subtle nondisplaced right
ninth lateral rib fracture.
IMPRESSION: Possible subtle nondisplaced right ninth lateral rib fracture. No
pneumothorax.
